# Patient Record
Sex: Female | Born: 1935 | Race: White | Hispanic: No | Marital: Married | Smoking: Never smoker
Health system: Southern US, Community
[De-identification: ages and names within clinical notes are randomized; demographics above are authoritative.]

## PROBLEM LIST (undated history)

## (undated) DIAGNOSIS — F0390 Unspecified dementia without behavioral disturbance: Secondary | ICD-10-CM

## (undated) DIAGNOSIS — N189 Chronic kidney disease, unspecified: Secondary | ICD-10-CM

## (undated) DIAGNOSIS — R64 Cachexia: Secondary | ICD-10-CM

## (undated) DIAGNOSIS — M069 Rheumatoid arthritis, unspecified: Secondary | ICD-10-CM

## (undated) HISTORY — PX: ABDOMINAL HYSTERECTOMY: SHX81

---

## 2014-11-26 ENCOUNTER — Emergency Department (HOSPITAL_BASED_OUTPATIENT_CLINIC_OR_DEPARTMENT_OTHER): Payer: Medicare Other

## 2014-11-26 ENCOUNTER — Encounter (HOSPITAL_BASED_OUTPATIENT_CLINIC_OR_DEPARTMENT_OTHER): Payer: Self-pay

## 2014-11-26 ENCOUNTER — Emergency Department (HOSPITAL_BASED_OUTPATIENT_CLINIC_OR_DEPARTMENT_OTHER)
Admission: EM | Admit: 2014-11-26 | Discharge: 2014-11-26 | Disposition: A | Discharge status: Home / Self Care | Payer: Medicare Other | Attending: Emergency Medicine | Admitting: Emergency Medicine

## 2014-11-26 DIAGNOSIS — R112 Nausea with vomiting, unspecified: Secondary | ICD-10-CM | POA: Diagnosis not present

## 2014-11-26 DIAGNOSIS — Z8739 Personal history of other diseases of the musculoskeletal system and connective tissue: Secondary | ICD-10-CM | POA: Diagnosis not present

## 2014-11-26 DIAGNOSIS — Z79899 Other long term (current) drug therapy: Secondary | ICD-10-CM | POA: Insufficient documentation

## 2014-11-26 DIAGNOSIS — R1013 Epigastric pain: Secondary | ICD-10-CM | POA: Diagnosis not present

## 2014-11-26 DIAGNOSIS — R079 Chest pain, unspecified: Secondary | ICD-10-CM | POA: Diagnosis present

## 2014-11-26 DIAGNOSIS — F0390 Unspecified dementia without behavioral disturbance: Secondary | ICD-10-CM | POA: Insufficient documentation

## 2014-11-26 HISTORY — DX: Unspecified dementia, unspecified severity, without behavioral disturbance, psychotic disturbance, mood disturbance, and anxiety: F03.90

## 2014-11-26 HISTORY — DX: Rheumatoid arthritis, unspecified: M06.9

## 2014-11-26 LAB — COMPREHENSIVE METABOLIC PANEL
ALT: 14 U/L (ref 0–35)
AST: 22 U/L (ref 0–37)
Albumin: 4.2 g/dL (ref 3.5–5.2)
Alkaline Phosphatase: 65 U/L (ref 39–117)
Anion gap: 8 (ref 5–15)
BUN: 16 mg/dL (ref 6–23)
CALCIUM: 8.9 mg/dL (ref 8.4–10.5)
CO2: 26 mmol/L (ref 19–32)
Chloride: 103 mmol/L (ref 96–112)
Creatinine, Ser: 0.83 mg/dL (ref 0.50–1.10)
GFR calc Af Amer: 74 mL/min — ABNORMAL LOW (ref >90)
GFR calc non Af Amer: 64 mL/min — ABNORMAL LOW (ref >90)
Glucose, Bld: 133 mg/dL — ABNORMAL HIGH (ref 70–99)
POTASSIUM: 3.5 mmol/L (ref 3.5–5.1)
Sodium: 137 mmol/L (ref 135–145)
TOTAL PROTEIN: 7 g/dL (ref 6.0–8.3)
Total Bilirubin: 0.5 mg/dL (ref 0.3–1.2)

## 2014-11-26 LAB — CBC WITH DIFFERENTIAL/PLATELET
BASOS PCT: 1 % (ref 0–1)
Basophils Absolute: 0.1 10*3/uL (ref 0.0–0.1)
Eosinophils Absolute: 0 10*3/uL (ref 0.0–0.7)
Eosinophils Relative: 0 % (ref 0–5)
HEMATOCRIT: 37.8 % (ref 36.0–46.0)
HEMOGLOBIN: 12.4 g/dL (ref 12.0–15.0)
Lymphocytes Relative: 8 % — ABNORMAL LOW (ref 12–46)
Lymphs Abs: 0.8 10*3/uL (ref 0.7–4.0)
MCH: 28.8 pg (ref 26.0–34.0)
MCHC: 32.8 g/dL (ref 30.0–36.0)
MCV: 87.9 fL (ref 78.0–100.0)
Monocytes Absolute: 0.6 10*3/uL (ref 0.1–1.0)
Monocytes Relative: 6 % (ref 3–12)
NEUTROS ABS: 8.3 10*3/uL — AB (ref 1.7–7.7)
Neutrophils Relative %: 85 % — ABNORMAL HIGH (ref 43–77)
PLATELETS: 192 10*3/uL (ref 150–400)
RBC: 4.3 MIL/uL (ref 3.87–5.11)
RDW: 13.5 % (ref 11.5–15.5)
WBC: 9.7 10*3/uL (ref 4.0–10.5)

## 2014-11-26 LAB — TROPONIN I: Troponin I: <0.03 ng/mL (ref <0.031)

## 2014-11-26 LAB — LIPASE, BLOOD: Lipase: 30 U/L (ref 11–59)

## 2014-11-26 MED ORDER — ESOMEPRAZOLE MAGNESIUM 40 MG PO CPDR: 40.0000 mg | DELAYED_RELEASE_CAPSULE | Freq: Every day | ORAL | Status: DC

## 2014-11-26 MED ORDER — ONDANSETRON HCL 4 MG/2ML IJ SOLN
4.0000 mg | Freq: Once | INTRAMUSCULAR | Status: AC
  Administered 2014-11-26: 4 mg via INTRAVENOUS
  Filled 2014-11-26: qty 2

## 2014-11-26 MED ORDER — FAMOTIDINE IN NACL 20-0.9 MG/50ML-% IV SOLN
20.0000 mg | Freq: Once | INTRAVENOUS | Status: AC
  Administered 2014-11-26: 20 mg via INTRAVENOUS
  Filled 2014-11-26: qty 50

## 2014-11-26 MED ORDER — ASPIRIN 81 MG PO CHEW
324.0000 mg | CHEWABLE_TABLET | Freq: Once | ORAL | Status: AC
  Administered 2014-11-26: 324 mg via ORAL
  Filled 2014-11-26: qty 4

## 2014-11-26 NOTE — ED Notes (Signed)
Husband states pt with CP and vomited x 3 approx 3pm today-pt with dementia-states she is having CP but is unable to answer all ?s

## 2014-11-26 NOTE — ED Notes (Signed)
Patient transported to X-ray 

## 2014-11-26 NOTE — ED Notes (Signed)
MD at bedside. 

## 2014-11-26 NOTE — ED Notes (Signed)
BSC set up in room. Husband is assisting pt

## 2014-11-26 NOTE — ED Provider Notes (Signed)
CSN: [id number]     Arrival date & time 11/26/14  1742 History  This chart was scribed for Connie Bucco, MD by Annye Asa, ED Scribe. This patient was seen in room MH12/MH12 and the patient's care was started at 6:30 PM.    Chief Complaint  Patient presents with  . Chest Pain   Patient is a 81 y.o. female presenting with chest pain. The history is provided by the patient and the spouse. No language interpreter was used.  Chest Pain Associated symptoms: nausea and vomiting   Associated symptoms: no cough, no diaphoresis, no fever and no shortness of breath     HPI Comments: Connie Holder is a 81 y.o. female with past medical history of rheumatoid arthritis, dementia who presents to the Emergency Department complaining of chest pain and vomiting (3x) beginning this afternoon around 15:00. Patient's husband explains that she began vomiting before complaining of chest pain. She feels nauseous at present but denies any pain. She denies diaphoresis, SOB, diarrhea, dysuria, difficulty urinating, cough or cold symptoms, fevers. Husband denies prior experience with similar symptoms.  Patient took her Donepezil HCl as prescribed this morning.    Past Medical History  Diagnosis Date  . RA (rheumatoid arthritis)   . Dementia    Past Surgical History  Procedure Laterality Date  . Abdominal hysterectomy     No family history on file. History  Substance Use Topics  . Smoking status: Never Smoker   . Smokeless tobacco: Not on file  . Alcohol Use: No   OB History    No data available     Review of Systems  Constitutional: Negative for fever and diaphoresis.  Respiratory: Negative for cough and shortness of breath.   Cardiovascular: Positive for chest pain.  Gastrointestinal: Positive for nausea and vomiting. Negative for diarrhea.  Genitourinary: Negative for dysuria and difficulty urinating.  All other systems reviewed and are negative.   Allergies  Review of patient's allergies  indicates no known allergies.  Home Medications   Prior to Admission medications   Medication Sig Start Date End Date Taking? Authorizing Provider  DONEPEZIL HCL PO Take by mouth.   Yes Historical Provider, MD  Levothyroxine Sodium (SYNTHROID PO) Take by mouth.   Yes Historical Provider, MD  Memantine HCl (NAMENDA PO) Take by mouth.   Yes Historical Provider, MD  esomeprazole (NEXIUM) 40 MG capsule Take 1 capsule (40 mg total) by mouth daily. 11/26/14   Connie Bucco, MD   BP 150/78 mmHg  Pulse 77  Temp(Src) 97.8 F (36.6 C) (Oral)  Resp 35  Wt 115 lb (52.164 kg)  SpO2 97% Physical Exam  Constitutional: She is oriented to person, place, and time. She appears well-developed and well-nourished.  HENT:  Head: Normocephalic and atraumatic.  Eyes: Pupils are equal, round, and reactive to light.  Neck: Normal range of motion. Neck supple.  Cardiovascular: Normal rate, regular rhythm and normal heart sounds.   Pulmonary/Chest: Effort normal and breath sounds normal. No respiratory distress. She has no wheezes. She has no rales. She exhibits no tenderness.  Abdominal: Soft. Bowel sounds are normal. There is no tenderness. There is no rebound and no guarding.  Musculoskeletal: Normal range of motion. She exhibits no edema.  Lymphadenopathy:    She has no cervical adenopathy.  Neurological: She is alert and oriented to person, place, and time.  Skin: Skin is warm and dry. No rash noted.  Psychiatric: She has a normal mood and affect.    ED Course  Procedures   DIAGNOSTIC STUDIES: Oxygen Saturation is 100% on RA, normal by my interpretation.    COORDINATION OF CARE: 6:37 PM Discussed treatment plan with pt at bedside and pt agreed to plan.  Results for orders placed or performed during the hospital encounter of 11/26/14  Comprehensive metabolic panel  Result Value Ref Range   Sodium 137 135 - 145 mmol/L   Potassium 3.5 3.5 - 5.1 mmol/L   Chloride 103 96 - 112 mmol/L   CO2 26 19 -  32 mmol/L   Glucose, Bld 133 (H) 70 - 99 mg/dL   BUN 16 6 - 23 mg/dL   Creatinine, Ser 6.21 0.50 - 1.10 mg/dL   Calcium 8.9 8.4 - 30.8 mg/dL   Total Protein 7.0 6.0 - 8.3 g/dL   Albumin 4.2 3.5 - 5.2 g/dL   AST 22 0 - 37 U/L   ALT 14 0 - 35 U/L   Alkaline Phosphatase 65 39 - 117 U/L   Total Bilirubin 0.5 0.3 - 1.2 mg/dL   GFR calc non Af Amer 64 (L) >90 mL/min   GFR calc Af Amer 74 (L) >90 mL/min   Anion gap 8 5 - 15  Lipase, blood  Result Value Ref Range   Lipase 30 11 - 59 U/L  CBC with Differential  Result Value Ref Range   WBC 9.7 4.0 - 10.5 K/uL   RBC 4.30 3.87 - 5.11 MIL/uL   Hemoglobin 12.4 12.0 - 15.0 g/dL   HCT 65.7 84.6 - 96.2 %   MCV 87.9 78.0 - 100.0 fL   MCH 28.8 26.0 - 34.0 pg   MCHC 32.8 30.0 - 36.0 g/dL   RDW 95.2 84.1 - 32.4 %   Platelets 192 150 - 400 K/uL   Neutrophils Relative % 85 (H) 43 - 77 %   Neutro Abs 8.3 (H) 1.7 - 7.7 K/uL   Lymphocytes Relative 8 (L) 12 - 46 %   Lymphs Abs 0.8 0.7 - 4.0 K/uL   Monocytes Relative 6 3 - 12 %   Monocytes Absolute 0.6 0.1 - 1.0 K/uL   Eosinophils Relative 0 0 - 5 %   Eosinophils Absolute 0.0 0.0 - 0.7 K/uL   Basophils Relative 1 0 - 1 %   Basophils Absolute 0.1 0.0 - 0.1 K/uL  Troponin I  Result Value Ref Range   Troponin I <0.03 <0.031 ng/mL  Troponin I  Result Value Ref Range   Troponin I <0.03 <0.031 ng/mL   Dg Abd Acute W/chest  11/26/2014   CLINICAL DATA:  In with nausea and vomiting.  EXAM: ACUTE ABDOMEN SERIES (ABDOMEN 2 VIEW & CHEST 1 VIEW)  COMPARISON:  None.  FINDINGS: Normal cardiac contours. Tortuosity of the thoracic aorta. No consolidative pulmonary opacities. No pleural effusion or pneumothorax. Stool is demonstrated throughout the colon. No gaseous distended loops of small bowel are demonstrated. No evidence for free intraperitoneal air. Lower lumbar spine degenerative changes.  IMPRESSION: Stool throughout the colon as can be seen with constipation.  No acute cardiopulmonary process.    Electronically Signed   By: Annia Belt M.D.   On: 11/26/2014 20:10    Labs Review Labs Reviewed  COMPREHENSIVE METABOLIC PANEL - Abnormal; Notable for the following:    Glucose, Bld 133 (*)    GFR calc non Af Amer 64 (*)    GFR calc Af Amer 74 (*)    All other components within normal limits  CBC WITH DIFFERENTIAL/PLATELET - Abnormal; Notable for the  following:    Neutrophils Relative % 85 (*)    Neutro Abs 8.3 (*)    Lymphocytes Relative 8 (*)    All other components within normal limits  LIPASE, BLOOD  TROPONIN I  TROPONIN I   Imaging Review Dg Abd Acute W/chest  11/26/2014   CLINICAL DATA:  In with nausea and vomiting.  EXAM: ACUTE ABDOMEN SERIES (ABDOMEN 2 VIEW & CHEST 1 VIEW)  COMPARISON:  None.  FINDINGS: Normal cardiac contours. Tortuosity of the thoracic aorta. No consolidative pulmonary opacities. No pleural effusion or pneumothorax. Stool is demonstrated throughout the colon. No gaseous distended loops of small bowel are demonstrated. No evidence for free intraperitoneal air. Lower lumbar spine degenerative changes.  IMPRESSION: Stool throughout the colon as can be seen with constipation.  No acute cardiopulmonary process.   Electronically Signed   By: Annia Belt M.D.   On: 11/26/2014 20:10     EKG Interpretation   Date/Time:  Monday November 26 2014 17:50:24 EDT Ventricular Rate:  69 PR Interval:  156 QRS Duration: 84 QT Interval:  420 QTC Calculation: 450 R Axis:   65 Text Interpretation:  Sinus rhythm with occasional Premature ventricular  complexes Possible Left atrial enlargement Borderline ECG No old tracing  to compare Confirmed by Aminta Sakurai  MD, Ladarius Seubert ([id number]) on 11/27/2014 12:30:05 AM      MDM   Final diagnoses:  Epigastric pain   Patient presents with vague complaints of chest pain that started after vomiting. She had no other associated symptoms. Her EKG does not show any ischemia. She's had delta troponin that is negative. Her chest x-ray does not show any  evidence of acute disease or obstruction on her abdominal films. She was given dose of Zofran and Pepcid in the ED. She's feeling much better. I did discuss options with the patient and her husband. Options of being possibly admitting the patient for further cardiac evaluation versus close follow-up with her primary care physician. The patient and her husband are not wanting to be admitted at this point. I feel that her symptoms are more consistent with likely being GI in nature. Seems that the vomiting started rather suddenly and then her chest started hurting. She was not diaphoretic. Her symptoms were nonexertional. At this point I feel that she can be discharged home with close follow-up with her primary care physician. She was advised to return if her symptoms were to worsen.  I personally performed the services described in this documentation, which was scribed in my presence.  The recorded information has been reviewed and considered.      Connie Bucco, MD 11/27/14 [phone]

## 2015-08-09 ENCOUNTER — Emergency Department (HOSPITAL_COMMUNITY): Payer: Medicare Other

## 2015-08-09 ENCOUNTER — Inpatient Hospital Stay (HOSPITAL_COMMUNITY)
Admission: EM | Admit: 2015-08-09 | Discharge: 2015-08-13 | DRG: 470 | Disposition: A | Discharge status: Skilled Nursing Facility | Payer: Medicare Other | Attending: Internal Medicine | Admitting: Internal Medicine

## 2015-08-09 ENCOUNTER — Encounter (HOSPITAL_COMMUNITY): Payer: Self-pay | Admitting: Emergency Medicine

## 2015-08-09 DIAGNOSIS — F0391 Unspecified dementia with behavioral disturbance: Secondary | ICD-10-CM | POA: Diagnosis present

## 2015-08-09 DIAGNOSIS — Z419 Encounter for procedure for purposes other than remedying health state, unspecified: Secondary | ICD-10-CM

## 2015-08-09 DIAGNOSIS — Z91018 Allergy to other foods: Secondary | ICD-10-CM

## 2015-08-09 DIAGNOSIS — W010XXA Fall on same level from slipping, tripping and stumbling without subsequent striking against object, initial encounter: Secondary | ICD-10-CM | POA: Diagnosis present

## 2015-08-09 DIAGNOSIS — R509 Fever, unspecified: Secondary | ICD-10-CM | POA: Diagnosis not present

## 2015-08-09 DIAGNOSIS — Z91010 Allergy to peanuts: Secondary | ICD-10-CM

## 2015-08-09 DIAGNOSIS — S72011A Unspecified intracapsular fracture of right femur, initial encounter for closed fracture: Principal | ICD-10-CM | POA: Diagnosis present

## 2015-08-09 DIAGNOSIS — W19XXXA Unspecified fall, initial encounter: Secondary | ICD-10-CM | POA: Diagnosis not present

## 2015-08-09 DIAGNOSIS — D72829 Elevated white blood cell count, unspecified: Secondary | ICD-10-CM | POA: Diagnosis present

## 2015-08-09 DIAGNOSIS — D62 Acute posthemorrhagic anemia: Secondary | ICD-10-CM | POA: Diagnosis not present

## 2015-08-09 DIAGNOSIS — D649 Anemia, unspecified: Secondary | ICD-10-CM | POA: Diagnosis not present

## 2015-08-09 DIAGNOSIS — Y92099 Unspecified place in other non-institutional residence as the place of occurrence of the external cause: Secondary | ICD-10-CM

## 2015-08-09 DIAGNOSIS — Z79899 Other long term (current) drug therapy: Secondary | ICD-10-CM

## 2015-08-09 DIAGNOSIS — S72001A Fracture of unspecified part of neck of right femur, initial encounter for closed fracture: Secondary | ICD-10-CM

## 2015-08-09 DIAGNOSIS — Z886 Allergy status to analgesic agent status: Secondary | ICD-10-CM | POA: Diagnosis not present

## 2015-08-09 DIAGNOSIS — R739 Hyperglycemia, unspecified: Secondary | ICD-10-CM | POA: Diagnosis present

## 2015-08-09 DIAGNOSIS — Z96649 Presence of unspecified artificial hip joint: Secondary | ICD-10-CM

## 2015-08-09 DIAGNOSIS — M069 Rheumatoid arthritis, unspecified: Secondary | ICD-10-CM | POA: Diagnosis present

## 2015-08-09 DIAGNOSIS — Y92009 Unspecified place in unspecified non-institutional (private) residence as the place of occurrence of the external cause: Secondary | ICD-10-CM

## 2015-08-09 DIAGNOSIS — F0390 Unspecified dementia without behavioral disturbance: Secondary | ICD-10-CM | POA: Diagnosis not present

## 2015-08-09 LAB — CBC WITH DIFFERENTIAL/PLATELET
BASOS PCT: 0 %
Basophils Absolute: 0 10*3/uL (ref 0.0–0.1)
EOS PCT: 0 %
Eosinophils Absolute: 0 10*3/uL (ref 0.0–0.7)
HCT: 38.5 % (ref 36.0–46.0)
Hemoglobin: 12.4 g/dL (ref 12.0–15.0)
Lymphocytes Relative: 5 %
Lymphs Abs: 0.8 10*3/uL (ref 0.7–4.0)
MCH: 29.1 pg (ref 26.0–34.0)
MCHC: 32.2 g/dL (ref 30.0–36.0)
MCV: 90.4 fL (ref 78.0–100.0)
MONO ABS: 1.6 10*3/uL — AB (ref 0.1–1.0)
MONOS PCT: 10 %
NEUTROS ABS: 12.8 10*3/uL — AB (ref 1.7–7.7)
Neutrophils Relative %: 85 %
Platelets: 192 10*3/uL (ref 150–400)
RBC: 4.26 MIL/uL (ref 3.87–5.11)
RDW: 14.5 % (ref 11.5–15.5)
WBC: 15.3 10*3/uL — ABNORMAL HIGH (ref 4.0–10.5)

## 2015-08-09 LAB — PROTIME-INR
INR: 1.13 (ref 0.00–1.49)
Prothrombin Time: 14.7 seconds (ref 11.6–15.2)

## 2015-08-09 LAB — BASIC METABOLIC PANEL
ANION GAP: 9 (ref 5–15)
BUN: 14 mg/dL (ref 6–20)
CALCIUM: 8.8 mg/dL — AB (ref 8.9–10.3)
CO2: 24 mmol/L (ref 22–32)
Chloride: 106 mmol/L (ref 101–111)
Creatinine, Ser: 1.04 mg/dL — ABNORMAL HIGH (ref 0.44–1.00)
GFR calc Af Amer: 56 mL/min — ABNORMAL LOW (ref >60)
GFR calc non Af Amer: 48 mL/min — ABNORMAL LOW (ref >60)
GLUCOSE: 149 mg/dL — AB (ref 65–99)
Potassium: 3.8 mmol/L (ref 3.5–5.1)
Sodium: 139 mmol/L (ref 135–145)

## 2015-08-09 LAB — TYPE AND SCREEN
ABO/RH(D): O NEG
Antibody Screen: NEGATIVE

## 2015-08-09 MED ORDER — MORPHINE SULFATE (PF) 2 MG/ML IV SOLN
2.0000 mg | INTRAVENOUS | Status: DC | PRN
  Administered 2015-08-09: 2 mg via INTRAVENOUS
  Filled 2015-08-09: qty 1

## 2015-08-09 MED ORDER — SODIUM CHLORIDE 0.9 % IV BOLUS (SEPSIS)
500.0000 mL | Freq: Once | INTRAVENOUS | Status: AC
  Administered 2015-08-09: 500 mL via INTRAVENOUS

## 2015-08-09 MED ORDER — ONDANSETRON HCL 4 MG/2ML IJ SOLN: 4.0000 mg | Freq: Three times a day (TID) | INTRAMUSCULAR | Status: AC | PRN

## 2015-08-09 MED ORDER — HYDROCODONE-ACETAMINOPHEN 5-325 MG PO TABS
1.0000 | ORAL_TABLET | ORAL | Status: AC | PRN
  Filled 2015-08-09: qty 2

## 2015-08-09 MED ORDER — ONDANSETRON HCL 4 MG/2ML IJ SOLN
4.0000 mg | Freq: Once | INTRAMUSCULAR | Status: AC
  Administered 2015-08-09: 4 mg via INTRAVENOUS
  Filled 2015-08-09: qty 2

## 2015-08-09 NOTE — ED Notes (Signed)
Per EMS, patient had a fall outside [employer] around 1830. Missed step off, mechanical fall. She went inside the restaurant, and was able to get back in car ok. Once home, patient started complaining of right hip pain that was unbearable. Patient very anxious on arrival, and reports pain in right hip.

## 2015-08-09 NOTE — ED Provider Notes (Signed)
CSN: [id number]     Arrival date & time 08/09/15  2120 History   First MD Initiated Contact with Patient 08/09/15 2130     Chief Complaint  Patient presents with  . Fall     (Consider location/radiation/quality/duration/timing/severity/associated sxs/prior Treatment) Patient is a 81 y.o. female presenting with fall. The history is provided by the patient.  Fall This is a new problem. The current episode started today. The problem occurs intermittently. The problem has been gradually worsening. Associated symptoms include arthralgias and myalgias. Pertinent negatives include no abdominal pain, anorexia, change in bowel habit, chest pain, chills, congestion, coughing, diaphoresis, fatigue, fever, headaches, nausea, neck pain, numbness, rash, urinary symptoms, visual change or vomiting. The symptoms are aggravated by walking. She has tried rest, walking and position changes for the symptoms. The treatment provided no relief.    Past Medical History  Diagnosis Date  . RA (rheumatoid arthritis) (HCC)   . Dementia    Past Surgical History  Procedure Laterality Date  . Abdominal hysterectomy     History reviewed. No pertinent family history. Social History  Substance Use Topics  . Smoking status: Never Smoker   . Smokeless tobacco: None  . Alcohol Use: No   OB History    No data available     Review of Systems  Constitutional: Negative for fever, chills, diaphoresis and fatigue.  HENT: Negative for congestion and trouble swallowing.   Eyes: Negative for pain and visual disturbance.  Respiratory: Negative for cough.   Cardiovascular: Negative for chest pain.  Gastrointestinal: Negative for nausea, vomiting, abdominal pain, anorexia and change in bowel habit.  Genitourinary: Negative for dysuria, flank pain, vaginal bleeding and vaginal discharge.  Musculoskeletal: Positive for myalgias, arthralgias and gait problem. Negative for back pain and neck pain.  Skin: Negative for rash.   Neurological: Negative for numbness and headaches.  Psychiatric/Behavioral: Positive for confusion.      Allergies  Review of patient's allergies indicates no known allergies.  Home Medications   Prior to Admission medications   Medication Sig Start Date End Date Taking? Authorizing Provider  DONEPEZIL HCL PO Take by mouth.    Historical Provider, MD  esomeprazole (NEXIUM) 40 MG capsule Take 1 capsule (40 mg total) by mouth daily. 11/26/14   Rolan Bucco, MD  Levothyroxine Sodium (SYNTHROID PO) Take by mouth.    Historical Provider, MD  Memantine HCl (NAMENDA PO) Take by mouth.    Historical Provider, MD   There were no vitals taken for this visit. Physical Exam  Constitutional: She appears well-developed and well-nourished. She appears distressed.  HENT:  Head: Normocephalic and atraumatic.  Mouth/Throat: No oropharyngeal exudate.  Eyes: Conjunctivae and EOM are normal. Pupils are equal, round, and reactive to light.  Neck: Normal range of motion. Neck supple.  Cardiovascular: Normal rate, regular rhythm and normal heart sounds.  Exam reveals no gallop and no friction rub.   No murmur heard. Pulmonary/Chest: Effort normal and breath sounds normal. No respiratory distress. She has no wheezes. She has no rales. She exhibits no tenderness.  Abdominal: Soft. Bowel sounds are normal. She exhibits no distension. There is no tenderness. There is no rebound and no guarding.  Musculoskeletal:       Right hip: She exhibits decreased range of motion, decreased strength, tenderness, bony tenderness and deformity.       Right knee: She exhibits normal range of motion, no swelling, no effusion, no ecchymosis and no deformity. No tenderness found.  Right ankle: She exhibits normal range of motion, no swelling, no ecchymosis, no deformity and no laceration. No tenderness.       Cervical back: She exhibits normal range of motion, no tenderness, no bony tenderness, no edema, no deformity and  no pain.       Right upper leg: She exhibits no tenderness, no bony tenderness, no swelling, no edema and no deformity.       Right lower leg: She exhibits no tenderness, no bony tenderness, no swelling, no edema and no deformity.  Neurological: She is alert. No cranial nerve deficit.  Skin: Skin is warm and dry.  Psychiatric: She has a normal mood and affect. Her speech is normal. Cognition and memory are impaired.    ED Course  Procedures (including critical care time) Labs Review Labs Reviewed  CBC WITH DIFFERENTIAL/PLATELET - Abnormal; Notable for the following:    WBC 15.3 (*)    Neutro Abs 12.8 (*)    Monocytes Absolute 1.6 (*)    All other components within normal limits  PROTIME-INR  BASIC METABOLIC PANEL  TYPE AND SCREEN    Imaging Review Dg Hip Unilat  With Pelvis 2-3 Views Right  08/09/2015  CLINICAL DATA:  Right hip pain. EXAM: DG HIP (WITH OR WITHOUT PELVIS) 2-3V RIGHT COMPARISON:  None. FINDINGS: There is an acute subcapital femoral neck fracture involving the right hip. There is proximal displacement of the distal fracture fragments. No dislocation. IMPRESSION: 1. Acute subcapital fracture involves the right hip. Electronically Signed   By: Signa Kell M.D.   On: 08/09/2015 21:57   I have personally reviewed and evaluated these images and lab results as part of my medical decision-making.   EKG Interpretation None      MDM   Final diagnoses:  None    81 year old Caucasian female presents in setting of fall. Per husband as patient has dementia, they were walking to restaurant tonight when patient tripped on a curb falling onto her right hip. Patient was able to get up after event and walked in to restaurant. Patient had dinner but when trying to leave the restaurant she began to complain of right hip pain. Pain continued to worsen and she was brought to the emergency department for further evaluation.  On arrival patient was hemodynamically stable with  deformity to her right hip with significant pain. X-ray revealed hip fracture. I personally reviewed imaging. Considering this finding orthopedics was consulted. I discussed case with Dr. Roda Shutters please patient is appropriate for medicine admission. Patient will have surgical intervention tomorrow.  IV pain medications given  Medicine was consult and for admission and patient is not currently on any medications. Patient will be admitted to medicine this time for further management of hip fracture. Patient stable at time of admission.  Pain improved on reassessment.  Attending has seen and evaluated patient and Dr. Jeraldine Loots is in agreement with plan.    Stacy Gardner, MD 08/10/15 Moses Manners  Gerhard Munch, MD 08/10/15 [phone]

## 2015-08-09 NOTE — H&P (Signed)
[facility] Admission History and Physical       Diala Waxman BWL:[id number] DOB: 09-10-1929 DOA: 08/09/2015  Referring physician: EDP PCP: Dr. Derrell Lolling Specialists:   Chief Complaint: Right Hip Pain after Falling  HPI: Connie Holder is a 81 y.o. female with a history of Rheumatoid Arthritis, and Dementia who was brought to the ED after she got out of the car and stepped on the curb and stumbled and fell backward onto her right side.  She had increased pain walking into the restaurant, and the pain kept increasing so she was brought to the ED.  An X-ray revealed a subcapital fracture of the right hip.  DR. Xu of Orthopedics was consulted and patient was referred for admission   Review of Systems: Unable to Obtain from the Patient  Past Medical History  Diagnosis Date  . RA (rheumatoid arthritis) (HCC)   . Dementia      Past Surgical History  Procedure Laterality Date  . Abdominal hysterectomy        Prior to Admission medications   Medication Sig Start Date End Date Taking? Authorizing Provider  esomeprazole (NEXIUM) 40 MG capsule Take 1 capsule (40 mg total) by mouth daily. Patient not taking: Reported on 08/09/2015 11/26/14   Rolan Bucco, MD     Allergies  Allergen Reactions  . Aspirin Other (See Comments)  . Other Swelling    All nut products Mouth sores  . Peanut Oil Other (See Comments)    Social History:  reports that she has never smoked. She does not have any smokeless tobacco history on file. She reports that she does not drink alcohol. Her drug history is not on file.     History reviewed. No pertinent family history.     Physical Exam:  GEN:  Pleasant Thin Elderly 81 y.o. Caucasian female examined and in no acute distress; cooperative with exam Filed Vitals:   08/09/15 2215 08/09/15 2230 08/09/15 2245 08/09/15 2300  BP:  138/65 137/67 140/65  Pulse:  65 68 69  Temp: 98.1 F (36.7 C)     TempSrc: Oral     Resp:      SpO2:  97% 98% 96%    Blood pressure 140/65, pulse 69, temperature 98.1 F (36.7 C), temperature source Oral, resp. rate 16, SpO2 96 %. PSYCH: She is alert and oriented x4; does not appear anxious does not appear depressed; affect is normal HEENT: Normocephalic and Atraumatic, Mucous membranes pink; PERRLA; EOM intact; Fundi:  Benign;  No scleral icterus, Nares: Patent, Oropharynx: Clear, Fair Dentition,    Neck:  FROM, No Cervical Lymphadenopathy nor Thyromegaly or Carotid Bruit; No JVD; Breasts:: Not examined CHEST WALL: No tenderness CHEST: Normal respiration, clear to auscultation bilaterally HEART: Regular rate and rhythm; no murmurs rubs or gallops BACK: No kyphosis or scoliosis; No CVA tenderness ABDOMEN: Positive Bowel Sounds, Scaphoid, Soft Non-Tender, No Rebound or Guarding; No Masses, No Organomegaly. Rectal Exam: Not done EXTREMITIES: No Cyanosis, Clubbing, or Edema; No Ulcerations. Genitalia: not examined PULSES: 2+ and symmetric SKIN: Normal hydration no rash or ulceration CNS:  Alert and Oriented x1, No Focal Deficits Vascular: pulses palpable throughout    Labs on Admission:  Basic Metabolic Panel: No results for input(s): NA, K, CL, CO2, GLUCOSE, BUN, CREATININE, CALCIUM, MG, PHOS in the last 168 hours. Liver Function Tests: No results for input(s): AST, ALT, ALKPHOS, BILITOT, PROT, ALBUMIN in the last 168 hours. No results for input(s): LIPASE, AMYLASE in the last 168 hours. No results for  input(s): AMMONIA in the last 168 hours. CBC:  Recent Labs Lab 08/09/15 2237  WBC 15.3*  NEUTROABS 12.8*  HGB 12.4  HCT 38.5  MCV 90.4  PLT 192   Cardiac Enzymes: No results for input(s): CKTOTAL, CKMB, CKMBINDEX, TROPONINI in the last 168 hours.  BNP (last 3 results) No results for input(s): BNP in the last 8760 hours.  ProBNP (last 3 results) No results for input(s): PROBNP in the last 8760 hours.  CBG: No results for input(s): GLUCAP in the last 168 hours.  Radiological Exams  on Admission: Dg Hip Unilat  With Pelvis 2-3 Views Right  08/09/2015  CLINICAL DATA:  Right hip pain. EXAM: DG HIP (WITH OR WITHOUT PELVIS) 2-3V RIGHT COMPARISON:  None. FINDINGS: There is an acute subcapital femoral neck fracture involving the right hip. There is proximal displacement of the distal fracture fragments. No dislocation. IMPRESSION: 1. Acute subcapital fracture involves the right hip. Electronically Signed   By: Signa Kell M.D.   On: 08/09/2015 21:57      Assessment/Plan:     81 y.o.  female with  Active Problems:   1.     Closed right hip fracture [facility])   Orthopedics Consulted Dr Roda Shutters to see in AM   NPO after Midnight   Pain Control with IV Dilaudid        2.     Fall at home- Mechanical in [place] Precautions     3.     RA (rheumatoid arthritis) (HCC)   Not On Rx     4.     Dementia   Not on Rx     5.     DVT prophylaxis   SCDs    Code Status:     FULL CODE     Family Communication:   Husband and Family at Bedside   Disposition Plan:    Inpatient Status        Time spent:  69 Minutes      Ron Parker [facility] Pager [phone]   If 7AM -7PM Please Contact the Day Rounding Team MD for [facility]  If 7PM-7AM, Please Contact Night-Floor Coverage  [email or web address] Password [facility] 08/09/2015, 11:39 PM     ADDENDUM:   Patient was seen and examined on 08/09/2015

## 2015-08-09 NOTE — ED Notes (Signed)
Patient currently in xray ?

## 2015-08-09 NOTE — ED Notes (Signed)
Called main lab to inquire about bmp results.

## 2015-08-10 ENCOUNTER — Inpatient Hospital Stay (HOSPITAL_COMMUNITY): Payer: Medicare Other

## 2015-08-10 ENCOUNTER — Inpatient Hospital Stay (HOSPITAL_COMMUNITY): Payer: Medicare Other | Admitting: Anesthesiology

## 2015-08-10 ENCOUNTER — Encounter (HOSPITAL_COMMUNITY)
Admission: EM | Disposition: A | Discharge status: Skilled Nursing Facility | Payer: Self-pay | Source: Home / Self Care | Attending: Internal Medicine

## 2015-08-10 DIAGNOSIS — D72829 Elevated white blood cell count, unspecified: Secondary | ICD-10-CM

## 2015-08-10 DIAGNOSIS — D649 Anemia, unspecified: Secondary | ICD-10-CM

## 2015-08-10 HISTORY — PX: TOTAL HIP ARTHROPLASTY: SHX124

## 2015-08-10 LAB — CBC
HCT: 35 % — ABNORMAL LOW (ref 36.0–46.0)
Hemoglobin: 11.4 g/dL — ABNORMAL LOW (ref 12.0–15.0)
MCH: 29.4 pg (ref 26.0–34.0)
MCHC: 32.6 g/dL (ref 30.0–36.0)
MCV: 90.2 fL (ref 78.0–100.0)
PLATELETS: 171 10*3/uL (ref 150–400)
RBC: 3.88 MIL/uL (ref 3.87–5.11)
RDW: 14.5 % (ref 11.5–15.5)
WBC: 13 10*3/uL — ABNORMAL HIGH (ref 4.0–10.5)

## 2015-08-10 LAB — BASIC METABOLIC PANEL
Anion gap: 8 (ref 5–15)
BUN: 11 mg/dL (ref 6–20)
CHLORIDE: 108 mmol/L (ref 101–111)
CO2: 23 mmol/L (ref 22–32)
CREATININE: 0.92 mg/dL (ref 0.44–1.00)
Calcium: 8.5 mg/dL — ABNORMAL LOW (ref 8.9–10.3)
GFR calc Af Amer: >60 mL/min (ref >60)
GFR calc non Af Amer: 56 mL/min — ABNORMAL LOW (ref >60)
GLUCOSE: 147 mg/dL — AB (ref 65–99)
Potassium: 4.2 mmol/L (ref 3.5–5.1)
Sodium: 139 mmol/L (ref 135–145)

## 2015-08-10 LAB — MRSA PCR SCREENING: MRSA by PCR: NEGATIVE

## 2015-08-10 LAB — ABO/RH: ABO/RH(D): O NEG

## 2015-08-10 SURGERY — ARTHROPLASTY, HIP, TOTAL, ANTERIOR APPROACH
Anesthesia: General | Site: Hip | Laterality: Right

## 2015-08-10 MED ORDER — ENOXAPARIN SODIUM 40 MG/0.4ML ~~LOC~~ SOLN
40.0000 mg | SUBCUTANEOUS | Status: DC
  Administered 2015-08-11 – 2015-08-13 (×3): 40 mg via SUBCUTANEOUS
  Filled 2015-08-10 (×3): qty 0.4

## 2015-08-10 MED ORDER — OXYCODONE HCL 5 MG PO TABS: 5.0000 mg | ORAL_TABLET | ORAL | Status: AC | PRN

## 2015-08-10 MED ORDER — SODIUM CHLORIDE 0.9 % IV SOLN
INTRAVENOUS | Status: DC
  Administered 2015-08-10: 01:00:00 via INTRAVENOUS

## 2015-08-10 MED ORDER — FENTANYL CITRATE (PF) 250 MCG/5ML IJ SOLN
INTRAMUSCULAR | Status: DC | PRN
  Administered 2015-08-10 (×2): 50 ug via INTRAVENOUS

## 2015-08-10 MED ORDER — ONDANSETRON HCL 4 MG/2ML IJ SOLN
INTRAMUSCULAR | Status: DC | PRN
  Administered 2015-08-10: 4 mg via INTRAVENOUS

## 2015-08-10 MED ORDER — PROPOFOL 10 MG/ML IV BOLUS
INTRAVENOUS | Status: DC | PRN
  Administered 2015-08-10: 50 mg via INTRAVENOUS

## 2015-08-10 MED ORDER — PHENYLEPHRINE HCL 10 MG/ML IJ SOLN
INTRAMUSCULAR | Status: DC | PRN
  Administered 2015-08-10 (×3): 80 ug via INTRAVENOUS

## 2015-08-10 MED ORDER — PROPOFOL 10 MG/ML IV BOLUS
INTRAVENOUS | Status: AC
  Filled 2015-08-10: qty 20

## 2015-08-10 MED ORDER — CEFAZOLIN SODIUM-DEXTROSE 2-3 GM-% IV SOLR
2.0000 g | Freq: Once | INTRAVENOUS | Status: AC
  Administered 2015-08-10: 2 g via INTRAVENOUS
  Filled 2015-08-10 (×2): qty 50

## 2015-08-10 MED ORDER — ONDANSETRON HCL 4 MG/2ML IJ SOLN
INTRAMUSCULAR | Status: AC
  Filled 2015-08-10: qty 2

## 2015-08-10 MED ORDER — OXYCODONE HCL 5 MG PO TABS
5.0000 mg | ORAL_TABLET | ORAL | Status: DC | PRN
  Administered 2015-08-10 – 2015-08-11 (×2): 5 mg via ORAL
  Administered 2015-08-12 – 2015-08-13 (×2): 10 mg via ORAL
  Filled 2015-08-10: qty 1
  Filled 2015-08-10: qty 2
  Filled 2015-08-10: qty 1
  Filled 2015-08-10: qty 2

## 2015-08-10 MED ORDER — LIDOCAINE HCL (CARDIAC) 20 MG/ML IV SOLN
INTRAVENOUS | Status: AC
  Filled 2015-08-10: qty 5

## 2015-08-10 MED ORDER — PHENYLEPHRINE HCL 10 MG/ML IJ SOLN
10.0000 mg | INTRAMUSCULAR | Status: DC | PRN
  Administered 2015-08-10: 60 ug/min via INTRAVENOUS

## 2015-08-10 MED ORDER — PHENOL 1.4 % MT LIQD: 1.0000 | OROMUCOSAL | Status: DC | PRN

## 2015-08-10 MED ORDER — GLYCOPYRROLATE 0.2 MG/ML IJ SOLN
INTRAMUSCULAR | Status: AC
  Filled 2015-08-10: qty 3

## 2015-08-10 MED ORDER — SODIUM CHLORIDE 0.9 % IV SOLN
INTRAVENOUS | Status: DC
  Administered 2015-08-10: 125 mL/h via INTRAVENOUS
  Administered 2015-08-11 (×2): via INTRAVENOUS

## 2015-08-10 MED ORDER — HYDROMORPHONE HCL 1 MG/ML IJ SOLN
0.5000 mg | INTRAMUSCULAR | Status: DC | PRN
Start: 2015-08-10 — End: 2015-08-13
  Administered 2015-08-10 (×2): 0.5 mg via INTRAVENOUS
  Administered 2015-08-10 – 2015-08-12 (×3): 1 mg via INTRAVENOUS
  Administered 2015-08-12: 0.5 mg via INTRAVENOUS
  Filled 2015-08-10 (×5): qty 1

## 2015-08-10 MED ORDER — ACETAMINOPHEN 650 MG RE SUPP: 650.0000 mg | Freq: Four times a day (QID) | RECTAL | Status: DC | PRN

## 2015-08-10 MED ORDER — ALUM & MAG HYDROXIDE-SIMETH 200-200-20 MG/5ML PO SUSP: 30.0000 mL | Freq: Four times a day (QID) | ORAL | Status: DC | PRN

## 2015-08-10 MED ORDER — LACTATED RINGERS IV SOLN
INTRAVENOUS | Status: DC
  Administered 2015-08-10: 11:00:00 via INTRAVENOUS

## 2015-08-10 MED ORDER — METHOCARBAMOL 500 MG PO TABS: 500.0000 mg | ORAL_TABLET | Freq: Four times a day (QID) | ORAL | Status: DC | PRN

## 2015-08-10 MED ORDER — METOCLOPRAMIDE HCL 5 MG PO TABS: 5.0000 mg | ORAL_TABLET | Freq: Three times a day (TID) | ORAL | Status: DC | PRN

## 2015-08-10 MED ORDER — ONDANSETRON HCL 4 MG PO TABS: 4.0000 mg | ORAL_TABLET | Freq: Four times a day (QID) | ORAL | Status: DC | PRN

## 2015-08-10 MED ORDER — ONDANSETRON HCL 4 MG/2ML IJ SOLN: 4.0000 mg | Freq: Four times a day (QID) | INTRAMUSCULAR | Status: DC | PRN

## 2015-08-10 MED ORDER — ROCURONIUM BROMIDE 50 MG/5ML IV SOLN
INTRAVENOUS | Status: AC
  Filled 2015-08-10: qty 1

## 2015-08-10 MED ORDER — ACETAMINOPHEN 325 MG PO TABS
650.0000 mg | ORAL_TABLET | Freq: Four times a day (QID) | ORAL | Status: DC | PRN
  Administered 2015-08-11: 650 mg via ORAL
  Filled 2015-08-10 (×2): qty 2

## 2015-08-10 MED ORDER — MORPHINE SULFATE (PF) 2 MG/ML IV SOLN: 0.5000 mg | INTRAVENOUS | Status: DC | PRN

## 2015-08-10 MED ORDER — EPHEDRINE SULFATE 50 MG/ML IJ SOLN
INTRAMUSCULAR | Status: DC | PRN
  Administered 2015-08-10: 5 mg via INTRAVENOUS

## 2015-08-10 MED ORDER — ALUM & MAG HYDROXIDE-SIMETH 200-200-20 MG/5ML PO SUSP: 30.0000 mL | ORAL | Status: DC | PRN

## 2015-08-10 MED ORDER — NEOSTIGMINE METHYLSULFATE 10 MG/10ML IV SOLN
INTRAVENOUS | Status: AC
  Filled 2015-08-10: qty 1

## 2015-08-10 MED ORDER — SODIUM CHLORIDE 0.9 % IR SOLN
Status: DC | PRN
  Administered 2015-08-10: 1000 mL

## 2015-08-10 MED ORDER — LIDOCAINE HCL (CARDIAC) 20 MG/ML IV SOLN
INTRAVENOUS | Status: DC | PRN
  Administered 2015-08-10: 60 mg via INTRATRACHEAL

## 2015-08-10 MED ORDER — OXYCODONE HCL 5 MG PO TABS: 5.0000 mg | ORAL_TABLET | ORAL | Status: DC | PRN

## 2015-08-10 MED ORDER — METHOCARBAMOL 1000 MG/10ML IJ SOLN
500.0000 mg | Freq: Four times a day (QID) | INTRAVENOUS | Status: DC | PRN
  Filled 2015-08-10: qty 5

## 2015-08-10 MED ORDER — ENOXAPARIN SODIUM 40 MG/0.4ML ~~LOC~~ SOLN: 40.0000 mg | Freq: Every day | SUBCUTANEOUS | Status: DC

## 2015-08-10 MED ORDER — ROCURONIUM BROMIDE 100 MG/10ML IV SOLN
INTRAVENOUS | Status: DC | PRN
  Administered 2015-08-10: 50 mg via INTRAVENOUS

## 2015-08-10 MED ORDER — NEOSTIGMINE METHYLSULFATE 10 MG/10ML IV SOLN
INTRAVENOUS | Status: DC | PRN
  Administered 2015-08-10: 3 mg via INTRAVENOUS

## 2015-08-10 MED ORDER — PANTOPRAZOLE SODIUM 40 MG IV SOLR
40.0000 mg | INTRAVENOUS | Status: DC
  Administered 2015-08-11: 40 mg via INTRAVENOUS
  Filled 2015-08-10 (×2): qty 40

## 2015-08-10 MED ORDER — FENTANYL CITRATE (PF) 250 MCG/5ML IJ SOLN
INTRAMUSCULAR | Status: AC
  Filled 2015-08-10: qty 5

## 2015-08-10 MED ORDER — METOCLOPRAMIDE HCL 5 MG/ML IJ SOLN: 5.0000 mg | Freq: Three times a day (TID) | INTRAMUSCULAR | Status: DC | PRN

## 2015-08-10 MED ORDER — CEFAZOLIN SODIUM-DEXTROSE 2-3 GM-% IV SOLR
2.0000 g | Freq: Four times a day (QID) | INTRAVENOUS | Status: AC
  Administered 2015-08-10 – 2015-08-11 (×3): 2 g via INTRAVENOUS
  Filled 2015-08-10 (×3): qty 50

## 2015-08-10 MED ORDER — HYDROCODONE-ACETAMINOPHEN 5-325 MG PO TABS
1.0000 | ORAL_TABLET | Freq: Four times a day (QID) | ORAL | Status: DC | PRN
  Administered 2015-08-13: 2 via ORAL
  Filled 2015-08-10: qty 2

## 2015-08-10 MED ORDER — GLYCOPYRROLATE 0.2 MG/ML IJ SOLN
INTRAMUSCULAR | Status: DC | PRN
  Administered 2015-08-10: 0.6 mg via INTRAVENOUS

## 2015-08-10 MED ORDER — ACETAMINOPHEN 325 MG PO TABS: 650.0000 mg | ORAL_TABLET | Freq: Four times a day (QID) | ORAL | Status: DC | PRN

## 2015-08-10 MED ORDER — LACTATED RINGERS IV SOLN
INTRAVENOUS | Status: DC | PRN
  Administered 2015-08-10: 11:00:00 via INTRAVENOUS

## 2015-08-10 MED ORDER — 0.9 % SODIUM CHLORIDE (POUR BTL) OPTIME
TOPICAL | Status: DC | PRN
Start: 2015-08-10 — End: 2015-08-10
  Administered 2015-08-10: 1000 mL

## 2015-08-10 MED ORDER — MENTHOL 3 MG MT LOZG: 1.0000 | LOZENGE | OROMUCOSAL | Status: DC | PRN

## 2015-08-10 MED ORDER — FENTANYL CITRATE (PF) 100 MCG/2ML IJ SOLN: 25.0000 ug | INTRAMUSCULAR | Status: DC | PRN

## 2015-08-10 SURGICAL SUPPLY — 51 items
BLADE SAW SGTL 18X1.27X75 (BLADE) ×2 IMPLANT
BNDG COHESIVE 6X5 TAN STRL LF (GAUZE/BANDAGES/DRESSINGS) ×2 IMPLANT
CAPT HIP HEMI 2 ×2 IMPLANT
CELLS DAT CNTRL 66122 CELL SVR (MISCELLANEOUS) ×1 IMPLANT
COVER SURGICAL LIGHT HANDLE (MISCELLANEOUS) ×2 IMPLANT
DRAPE C-ARM 42X72 X-RAY (DRAPES) ×2 IMPLANT
DRAPE IMP U-DRAPE 54X76 (DRAPES) ×2 IMPLANT
DRAPE STERI IOBAN 125X83 (DRAPES) ×2 IMPLANT
DRAPE U-SHAPE 47X51 STRL (DRAPES) ×6 IMPLANT
DRESSING ALLEVYN LIFE SACRUM (GAUZE/BANDAGES/DRESSINGS) ×2 IMPLANT
DRSG AQUACEL AG ADV 3.5X10 (GAUZE/BANDAGES/DRESSINGS) IMPLANT
DRSG MEPILEX BORDER 4X8 (GAUZE/BANDAGES/DRESSINGS) ×2 IMPLANT
DURAPREP 26ML APPLICATOR (WOUND CARE) ×2 IMPLANT
ELECT BLADE 4.0 EZ CLEAN MEGAD (MISCELLANEOUS) ×2
ELECT REM PT RETURN 9FT ADLT (ELECTROSURGICAL) ×2
ELECTRODE BLDE 4.0 EZ CLN MEGD (MISCELLANEOUS) ×1 IMPLANT
ELECTRODE REM PT RTRN 9FT ADLT (ELECTROSURGICAL) ×1 IMPLANT
FACESHIELD WRAPAROUND (MASK) ×2 IMPLANT
GLOVE NEODERM STRL 7.5 LF PF (GLOVE) ×2 IMPLANT
GLOVE SURG NEODERM 7.5  LF PF (GLOVE) ×2
GLOVE SURG SYN 7.5  E (GLOVE) ×1
GLOVE SURG SYN 7.5 E (GLOVE) ×1 IMPLANT
GOWN SRG XL XLNG 56XLVL 4 (GOWN DISPOSABLE) ×1 IMPLANT
GOWN STRL NON-REIN XL XLG LVL4 (GOWN DISPOSABLE) ×1
GOWN STRL REUS W/ TWL LRG LVL3 (GOWN DISPOSABLE) IMPLANT
GOWN STRL REUS W/TWL LRG LVL3 (GOWN DISPOSABLE)
HANDPIECE INTERPULSE COAX TIP (DISPOSABLE) ×1
KIT BASIN OR (CUSTOM PROCEDURE TRAY) ×2 IMPLANT
MARKER SKIN DUAL TIP RULER LAB (MISCELLANEOUS) ×2 IMPLANT
PACK TOTAL JOINT (CUSTOM PROCEDURE TRAY) ×2 IMPLANT
PACK UNIVERSAL I (CUSTOM PROCEDURE TRAY) ×2 IMPLANT
PADDING CAST COTTON 6X4 STRL (CAST SUPPLIES) ×2 IMPLANT
RTRCTR WOUND ALEXIS 18CM MED (MISCELLANEOUS) ×2
SEALER BIPOLAR AQUA 6.0 (INSTRUMENTS) ×2 IMPLANT
SET HNDPC FAN SPRY TIP SCT (DISPOSABLE) ×1 IMPLANT
SOLUTION BETADINE 4OZ (MISCELLANEOUS) ×2 IMPLANT
STAPLER VISISTAT 35W (STAPLE) ×2 IMPLANT
SUT ETHIBOND 2 V 37 (SUTURE) ×2 IMPLANT
SUT ETHIBOND NAB CT1 #1 30IN (SUTURE) ×6 IMPLANT
SUT ETHILON 3 0 FSL (SUTURE) ×2 IMPLANT
SUT VIC AB 0 CT1 27 (SUTURE) ×1
SUT VIC AB 0 CT1 27XBRD ANBCTR (SUTURE) ×1 IMPLANT
SUT VIC AB 1 CT1 27 (SUTURE) ×1
SUT VIC AB 1 CT1 27XBRD ANBCTR (SUTURE) ×1 IMPLANT
SUT VIC AB 2-0 CT1 27 (SUTURE) ×2
SUT VIC AB 2-0 CT1 TAPERPNT 27 (SUTURE) ×2 IMPLANT
SYR 20CC LL (SYRINGE) ×2 IMPLANT
TOWEL OR 17X26 10 PK STRL BLUE (TOWEL DISPOSABLE) ×2 IMPLANT
TRAY FOLEY CATH 16FR SILVER (SET/KITS/TRAYS/PACK) IMPLANT
TUBE CONNECTING 12X1/4 (SUCTIONS) ×2 IMPLANT
YANKAUER SUCT BULB TIP NO VENT (SUCTIONS) ×2 IMPLANT

## 2015-08-10 NOTE — ED Notes (Signed)
Updated sharon, receiving RN, on patient status, pain medication administered and foley placed due to poor ability to follow direction. emt bringing patient up.

## 2015-08-10 NOTE — Discharge Instructions (Signed)
° ° °  1. Change dressings as needed °2. May shower but keep incisions covered and dry °3. Take lovenox to prevent blood clots °4. Take stool softeners as needed °5. Take pain meds as needed ° °

## 2015-08-10 NOTE — Anesthesia Postprocedure Evaluation (Signed)
Anesthesia Post Note  Patient: Connie Holder  Procedure(s) Performed: Procedure(s) (LRB): RIGHT HEMI-HIP ARTHROPLASTY ANTERIOR APPROACH (Right)  Patient location during evaluation: PACU Anesthesia Type: General Level of consciousness: awake and alert Pain management: pain level controlled Vital Signs Assessment: post-procedure vital signs reviewed and stable Respiratory status: spontaneous breathing, nonlabored ventilation and respiratory function stable Cardiovascular status: blood pressure returned to baseline and stable Postop Assessment: no signs of nausea or vomiting Anesthetic complications: no    Last Vitals:  Filed Vitals:   08/10/15 1349 08/10/15 1407  BP:    Pulse: 80   Temp:  36.4 C  Resp: 25     Last Pain:  Filed Vitals:   08/10/15 1407  PainSc: Asleep                 [name],W. EDMOND

## 2015-08-10 NOTE — Anesthesia Preprocedure Evaluation (Addendum)
Anesthesia Evaluation  Patient identified by MRN, date of birth, ID band Patient awake    Reviewed: Allergy & Precautions, H&P , NPO status , Patient's Chart, lab work & pertinent test results  Airway Mallampati: II  TM Distance: >3 FB Neck ROM: Full    Dental no notable dental hx. (+) Partial Lower, Dental Advisory Given   Pulmonary neg pulmonary ROS,    Pulmonary exam normal breath sounds clear to auscultation       Cardiovascular negative cardio ROS   Rhythm:Regular Rate:Normal     Neuro/Psych Dementia negative neurological ROS  negative psych ROS   GI/Hepatic negative GI ROS, Neg liver ROS,   Endo/Other  negative endocrine ROS  Renal/GU negative Renal ROS  negative genitourinary   Musculoskeletal  (+) Arthritis , Rheumatoid disorders,    Abdominal   Peds  Hematology negative hematology ROS (+)   Anesthesia Other Findings   Reproductive/Obstetrics negative OB ROS                            Anesthesia Physical Anesthesia Plan  ASA: II  Anesthesia Plan: General   Post-op Pain Management:    Induction: Intravenous  Airway Management Planned: Oral ETT  Additional Equipment:   Intra-op Plan:   Post-operative Plan: Extubation in OR  Informed Consent: I have reviewed the patients History and Physical, chart, labs and discussed the procedure including the risks, benefits and alternatives for the proposed anesthesia with the patient or authorized representative who has indicated his/her understanding and acceptance.   Dental advisory given  Plan Discussed with: CRNA  Anesthesia Plan Comments:         Anesthesia Quick Evaluation

## 2015-08-10 NOTE — ED Notes (Signed)
Attempted to assist to void with fracture pan, patient unable to follow directions.

## 2015-08-10 NOTE — Op Note (Signed)
RIGHT HEMI-HIP ARTHROPLASTY ANTERIOR APPROACH  Procedure Note Connie Holder   [id number]  Pre-op Diagnosis: right hip fracture     Post-op Diagnosis: same   Operative Procedures  1. Prosthetic replacement for femoral neck fracture. CPT [phone]  Personnel  Surgeon(s): Tarry Kos, MD   Anesthesia: general  Prosthesis: Depuy Femur: Corail KA 11 Head: 45 mm size: +1.5 Bearing Type: Bipolar  Date of Service: 08/09/2015 - 08/10/2015  Hip Hemiarthroplasty (Anterior Approach) Op Note:  After informed consent was obtained and the operative extremity marked in the holding area, the patient was brought back to the operating room and placed supine on the HANA table. Next, the operative extremity was prepped and draped in normal sterile fashion. Surgical timeout occurred verifying patient identification, surgical site, surgical procedure and administration of antibiotics.  A modified anterior Smith-Peterson approach to the hip was performed, using the interval between tensor fascia lata and sartorius.  Dissection was carried bluntly down onto the anterior hip capsule. The lateral femoral circumflex vessels were identified and coagulated. A capsulotomy was performed and the capsular flaps tagged for later repair.  Fluoroscopy was utilized to prepare for the femoral neck cut. The neck osteotomy was performed. The femoral head was removed and found a 45 mm head was the appropriate fit.    We then turned our attention to the femur.  After placing the femoral hook, the leg was taken to externally rotated, extended and adducted position taking care to perform soft tissue releases to allow for adequate mobilization of the femur. Soft tissue was cleared from the shoulder of the greater trochanter and the hook elevator used to improve exposure of the proximal femur. Sequential broaching performed up to a size 11. Trial neck and head were placed. The leg was brought back up to neutral and the construct reduced.  The position and sizing of components, offset and leg lengths were checked using fluoroscopy. Stability of the construct was checked in extension and external rotation without any subluxation or impingement of prosthesis. We dislocated the prosthesis, dropped the leg back into position, removed trial components, and irrigated copiously. The final stem and head was then placed, the leg brought back up, the system reduced and fluoroscopy used to verify positioning.  We irrigated, obtained hemostasis and closed the capsule using #2 ethibond suture.  The fascia was closed with #1 vicryl plus, the deep fat layer was closed with 0 vicryl, the subcutaneous layers closed with 2.0 Vicryl Plus and the skin closed with staples. A sterile dressing was applied. The patient was awakened in the operating room and taken to recovery in stable condition. All sponge, needle, and instrument counts were correct at the end of the case.   Position: supine  Complications: none.  Time Out: performed   Drains/Packing: none  Estimated blood loss: 200 cc  Returned to Recovery Room: in good condition.   Antibiotics: yes   Mechanical VTE (DVT) Prophylaxis: sequential compression devices, TED thigh-high  Chemical VTE (DVT) Prophylaxis: lovenox  Fluid Replacement: Crystalloid: see anesthesia record  Specimens Removed: 1 to pathology   Sponge and Instrument Count Correct? yes   PACU: portable radiograph - low AP   Admission: inpatient status, start PT & OT POD#1  Plan/RTC: Return in 2 weeks for staple removal. Return in 6 weeks to see MD.  Weight Bearing/Load Lower Extremity: full  Hip precautions: none Suture Removal: 10-14 days  Betadine to incision twice daily once dressing is removed on POD#7  [name], MD  [facility] [phone] 1:09 PM     Implant Name Type Inv. Item Serial No. Manufacturer Lot No. LRB No. Used  STEM CORAIL KA11 - [id number] Stem STEM CORAIL KA11  DEPUY [id number] Right 1   HIP BALL ARTICU DEPUY - [id number] Hips HIP BALL ARTICU DEPUY  DEPUY  Right 1  HIP BALL ARTICU DEPUY - [id number] Hips HIP BALL ARTICU DEPUY   DEPUY [id number] Right 1

## 2015-08-10 NOTE — Anesthesia Procedure Notes (Signed)
Procedure Name: Intubation Date/Time: 08/10/2015 11:40 AM Performed by: Alanda Amass A Pre-anesthesia Checklist: Patient identified, Emergency Drugs available, Suction available, Patient being monitored and Timeout performed Patient Re-evaluated:Patient Re-evaluated prior to inductionOxygen Delivery Method: Circle system utilized Preoxygenation: Pre-oxygenation with 100% oxygen Intubation Type: IV induction Ventilation: Mask ventilation without difficulty and Oral airway inserted - appropriate to patient size Laryngoscope Size: Glidescope Grade View: Grade I Tube type: Oral Tube size: 7.0 mm Number of attempts: 1 Airway Equipment and Method: Stylet and Video-laryngoscopy Placement Confirmation: ETT inserted through vocal cords under direct vision,  positive ETCO2 and breath sounds checked- equal and bilateral Secured at: 21 cm Tube secured with: Tape Dental Injury: Teeth and Oropharynx as per pre-operative assessment

## 2015-08-10 NOTE — Transfer of Care (Signed)
Immediate Anesthesia Transfer of Care Note  Patient: Connie Holder  Procedure(s) Performed: Procedure(s): RIGHT HEMI-HIP ARTHROPLASTY ANTERIOR APPROACH (Right)  Patient Location: PACU  Anesthesia Type:General  Level of Consciousness: awake  Airway & Oxygen Therapy: Patient Spontanous Breathing and Patient connected to face mask oxygen  Post-op Assessment: Report given to RN, Post -op Vital signs reviewed and stable and Patient moving all extremities  Post vital signs: Reviewed and stable  Last Vitals:  Filed Vitals:   08/10/15 0054 08/10/15 0431  BP: 146/76 112/96  Pulse: 71 96  Temp: 36.7 C 37.2 C  Resp: 18 18    Complications: No apparent anesthesia complications

## 2015-08-10 NOTE — ED Notes (Signed)
Verbal order from dr. Leretha Pol to place foley. 28ft placed with urine return.
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
## 2015-08-10 NOTE — Consult Note (Signed)
ORTHOPAEDIC CONSULTATION  REQUESTING PHYSICIAN: Osvaldo Shipper, MD  Chief Complaint: Right hip fracture  HPI: Connie Holder is a 81 y.o. female who presents with right hip fracture s/p mechanical fall.  The patient endorses severe pain in the right hip, that does not radiate, grinding in quality, worse with any movement, better with immobilization.  Denies LOC/fever/chills/nausea/vomiting.  Walks with assistive devices (walker, cane, wheelchair).  Does live independently with husband.  Past Medical History  Diagnosis Date  . RA (rheumatoid arthritis) (HCC)   . Dementia    Past Surgical History  Procedure Laterality Date  . Abdominal hysterectomy     Social History   Social History  . Marital Status: Married    Spouse Name: N/A  . Number of Children: N/A  . Years of Education: N/A   Social History Main Topics  . Smoking status: Never Smoker   . Smokeless tobacco: None  . Alcohol Use: No  . Drug Use: None  . Sexual Activity: Not Asked   Other Topics Concern  . None   Social History Narrative   History reviewed. No pertinent family history. Allergies  Allergen Reactions  . Aspirin Other (See Comments)  . Other Swelling    All nut products Mouth sores  . Peanut Oil Other (See Comments)   Prior to Admission medications   Medication Sig Start Date End Date Taking? Authorizing Provider  esomeprazole (NEXIUM) 40 MG capsule Take 1 capsule (40 mg total) by mouth daily. Patient not taking: Reported on 08/09/2015 11/26/14   Rolan Bucco, MD  Family history is negative for RA   Dg Chest 1 View  08/09/2015  CLINICAL DATA:  Severe right-sided hip and back pain, dementia, fell outside at 1630 hours when missed a step EXAM: CHEST 1 VIEW COMPARISON:  11/26/2014 FINDINGS: Enlargement of cardiac silhouette. Mediastinal contours and pulmonary vascularity normal. Atherosclerotic calcification aorta. Bronchitic changes with bibasilar atelectasis greater on LEFT. No acute  infiltrate, pleural effusion or pneumothorax. Diffuse osseous demineralization with scattered degenerative changes of the spine. IMPRESSION: Enlargement of cardiac silhouette. Bronchitic changes with bibasilar atelectasis. Electronically Signed   By: Ulyses Southward M.D.   On: 08/09/2015 23:45   Dg Hip Unilat  With Pelvis 2-3 Views Right  08/09/2015  CLINICAL DATA:  Right hip pain. EXAM: DG HIP (WITH OR WITHOUT PELVIS) 2-3V RIGHT COMPARISON:  None. FINDINGS: There is an acute subcapital femoral neck fracture involving the right hip. There is proximal displacement of the distal fracture fragments. No dislocation. IMPRESSION: 1. Acute subcapital fracture involves the right hip. Electronically Signed   By: Signa Kell M.D.   On: 08/09/2015 21:57   Dg Femur, Min 2 Views Right  08/09/2015  CLINICAL DATA:  Severe right hip and back pain. Larey Seat outside around 18 30. Patient was initially ambulatory but when she returned home developed increasing pain. EXAM: RIGHT FEMUR 2 VIEWS COMPARISON:  None. FINDINGS: There is a transverse fracture of the right femoral neck with superior displacement of the distal fracture fragment resulting in varus angulation. No dislocation at the hip joint. Visualized right hemipelvis appears intact. Femoral shaft and right knee appear intact with degenerative changes in the right knee. No destructive bone lesions. IMPRESSION: Acute transverse fracture of the right femoral neck with varus angulation. Electronically Signed   By: Burman Nieves M.D.   On: 08/09/2015 23:43   Xrays independently reviewed and interpreted  Positive ROS: All other systems have been reviewed and were otherwise negative with the exception of those mentioned  in the HPI and as above.  Physical Exam: General: Alert, no acute distress Cardiovascular: No pedal edema Respiratory: No cyanosis, no use of accessory musculature GI: No organomegaly, abdomen is soft and non-tender Skin: No lesions in the area of  chief complaint Neurologic: Sensation intact distally Psychiatric: Patient is competent for consent with normal mood and affect Lymphatic: No axillary or cervical lymphadenopathy  MUSCULOSKELETAL:  - severe pain with movement of the hip and extremity - skin intact - NVI distally - compartments soft  Assessment: Right hip fracture  Plan: - partial hip replacement is recommended, patient and family are aware of r/b/a and wish to proceed - consent obtained - medical optimization per primary team - surgery is planned for today  Thank you for the consult and the opportunity to see [name], MD [facility] [phone] 10:04 AM

## 2015-08-10 NOTE — Progress Notes (Signed)
[facility] PROGRESS NOTE  Connie Holder TKZ:[id number] DOB: 11-09-1929 DOA: 08/09/2015  PCP: Malka So., MD  Brief HPI: 81 year old Caucasian female with past medical history of rheumatoid arthritis, dementia, presented after a mechanical fall resulting in fracture of her right hip. She was hospitalized for further management.  Past medical history:  Past Medical History  Diagnosis Date  . RA (rheumatoid arthritis) (HCC)   . Dementia     Consultants: Orthopedics  Procedures: Plan is for surgery for right hip fracture today  Antibiotics: None  Subjective: Patient has dementia. Does not answer any of my questions. She is confused. Patient's husband is at bedside along with other family members. They mention that patient has been complaining of back pain.   Objective: Vital Signs  Filed Vitals:   08/10/15 0015 08/10/15 0030 08/10/15 0054 08/10/15 0431  BP: 140/83 142/68 146/76 112/96  Pulse: 71 71 71 96  Temp:   98.1 F (36.7 C) 98.9 F (37.2 C)  TempSrc:   Oral Oral  Resp:   18 18  Height:   5\' 3"  (1.6 m)   Weight:   57.561 kg (126 lb 14.4 oz)   SpO2: 99% 98% 99% 91%    Intake/Output Summary (Last 24 hours) at 08/10/15 1316 Last data filed at 08/10/15 1315  Gross per 24 hour  Intake   1400 ml  Output    550 ml  Net    850 ml   Filed Weights   08/10/15 0054  Weight: 57.561 kg (126 lb 14.4 oz)    General appearance: alert, appears stated age, distracted and no distress Cardio: regular rate and rhythm, S1, S2 normal, no murmur, click, rub or gallop GI: soft, non-tender; bowel sounds normal; no masses,  no organomegaly Extremities: Right lower extremity is externally rotated. Able to lift her left lower extremity without much difficulty. She is able to move her neck without any difficulty. Able to move her arms. Neurologic: Alert. Distracted. Disoriented. No obvious focal deficits.  Lab Results:  Basic Metabolic Panel:  Recent Labs Lab  08/09/15 2237 08/10/15 0455  NA 139 139  K 3.8 4.2  CL 106 108  CO2 24 23  GLUCOSE 149* 147*  BUN 14 11  CREATININE 1.04* 0.92  CALCIUM 8.8* 8.5*   CBC:  Recent Labs Lab 08/09/15 2237 08/10/15 0455  WBC 15.3* 13.0*  NEUTROABS 12.8*  --   HGB 12.4 11.4*  HCT 38.5 35.0*  MCV 90.4 90.2  PLT 192 171    Recent Results (from the past 240 hour(s))  MRSA PCR Screening     Status: None   Collection Time: 08/10/15 10:10 AM  Result Value Ref Range Status   MRSA by PCR NEGATIVE NEGATIVE Final    Comment:        The GeneXpert MRSA Assay (FDA approved for NASAL specimens only), is one component of a comprehensive MRSA colonization surveillance program. It is not intended to diagnose MRSA infection nor to guide or monitor treatment for MRSA infections.       Studies/Results: Dg Chest 1 View  08/09/2015  CLINICAL DATA:  Severe right-sided hip and back pain, dementia, fell outside at 1630 hours when missed a step EXAM: CHEST 1 VIEW COMPARISON:  11/26/2014 FINDINGS: Enlargement of cardiac silhouette. Mediastinal contours and pulmonary vascularity normal. Atherosclerotic calcification aorta. Bronchitic changes with bibasilar atelectasis greater on LEFT. No acute infiltrate, pleural effusion or pneumothorax. Diffuse osseous demineralization with scattered degenerative changes of the spine. IMPRESSION: Enlargement of cardiac silhouette.  Bronchitic changes with bibasilar atelectasis. Electronically Signed   By: Ulyses Southward M.D.   On: 08/09/2015 23:45   Dg Hip Operative Unilat With Pelvis Right  08/10/2015  CLINICAL DATA:  Patient status post right hip hemiarthroplasty. EXAM: OPERATIVE RIGHT HIP (WITH PELVIS IF PERFORMED) 2 VIEWS TECHNIQUE: Fluoroscopic spot image(s) were submitted for interpretation post-operatively. COMPARISON:  Hip radiograph 08/09/2015 FINDINGS: Two intraoperative fluoroscopic images of the right hip were submitted for interpretation. These demonstrate the patient to  be post right hip hemiarthroplasty. No definite evidence for acute abnormality. IMPRESSION: Postoperative changes right hip. Electronically Signed   By: Annia Belt M.D.   On: 08/10/2015 13:13   Dg Hip Unilat  With Pelvis 2-3 Views Right  08/09/2015  CLINICAL DATA:  Right hip pain. EXAM: DG HIP (WITH OR WITHOUT PELVIS) 2-3V RIGHT COMPARISON:  None. FINDINGS: There is an acute subcapital femoral neck fracture involving the right hip. There is proximal displacement of the distal fracture fragments. No dislocation. IMPRESSION: 1. Acute subcapital fracture involves the right hip. Electronically Signed   By: Signa Kell M.D.   On: 08/09/2015 21:57   Dg Femur, Min 2 Views Right  08/09/2015  CLINICAL DATA:  Severe right hip and back pain. Larey Seat outside around 18 30. Patient was initially ambulatory but when she returned home developed increasing pain. EXAM: RIGHT FEMUR 2 VIEWS COMPARISON:  None. FINDINGS: There is a transverse fracture of the right femoral neck with superior displacement of the distal fracture fragment resulting in varus angulation. No dislocation at the hip joint. Visualized right hemipelvis appears intact. Femoral shaft and right knee appear intact with degenerative changes in the right knee. No destructive bone lesions. IMPRESSION: Acute transverse fracture of the right femoral neck with varus angulation. Electronically Signed   By: Burman Nieves M.D.   On: 08/09/2015 23:43    Medications:  Scheduled: . [MAR Hold] pantoprazole (PROTONIX) IV  40 mg Intravenous Q24H   Continuous: . sodium chloride 75 mL/hr at 08/10/15 0102  . lactated ringers 10 mL/hr at 08/10/15 1038   PRN:0.9 % irrigation (POUR BTL), [MAR Hold] acetaminophen **OR** [MAR Hold] acetaminophen, [MAR Hold] alum & mag hydroxide-simeth, [MAR Hold]  HYDROmorphone (DILAUDID) injection, [MAR Hold]  morphine injection, [MAR Hold] oxyCODONE, sodium chloride irrigation  Assessment/Plan:  Active Problems:   Closed right  hip fracture (HCC)   Fall at home   RA (rheumatoid arthritis) (HCC)   Dementia    Right hip fracture Patient to go to the OR shortly. Medical consultation by Dr. Lovell Sheehan at the time of admission. There is some mention of back pain. It's quite possible that the fracture is causing referred pain to the back. She does not seem to have any weakness in her lower extremities. Her dementia makes it difficult to get accurate history. We will need to fully assess her after her hip surgery. If her back pain persists, she may need imaging studies to evaluate the pain.  History of dementia Appears to be at baseline. Continue to monitor. Reorient daily. Anticipate some worsening in mental status due to surgery and hospitalization. Check UA  History of rheumatoid arthritis Stable  Mild hyperglycemia No known history of diabetes. Monitor for now.  Normocytic anemia Likely due to hemodilution. Monitor for now  Leukocytosis Likely due to acute stress. No obvious source of infection. Continue to monitor.  DVT Prophylaxis: Definitive prophylaxis post operatively. Per orthopedics    Code Status: Full code  Family Communication: Discussed with the patient's husband  Disposition  Plan: Await surgery.    LOS: 1 day   [facility]  [facility] Pager [phone] 08/10/2015, 1:16 PM  If 7PM-7AM, please contact night-coverage at [email or web address], password [facility]

## 2015-08-11 DIAGNOSIS — F0391 Unspecified dementia with behavioral disturbance: Secondary | ICD-10-CM

## 2015-08-11 DIAGNOSIS — D62 Acute posthemorrhagic anemia: Secondary | ICD-10-CM

## 2015-08-11 LAB — CBC
HEMATOCRIT: 29.7 % — AB (ref 36.0–46.0)
HEMOGLOBIN: 9.5 g/dL — AB (ref 12.0–15.0)
MCH: 29.1 pg (ref 26.0–34.0)
MCHC: 32 g/dL (ref 30.0–36.0)
MCV: 91.1 fL (ref 78.0–100.0)
Platelets: 135 10*3/uL — ABNORMAL LOW (ref 150–400)
RBC: 3.26 MIL/uL — AB (ref 3.87–5.11)
RDW: 14.7 % (ref 11.5–15.5)
WBC: 15.1 10*3/uL — AB (ref 4.0–10.5)

## 2015-08-11 LAB — URINALYSIS, ROUTINE W REFLEX MICROSCOPIC
Bilirubin Urine: NEGATIVE
GLUCOSE, UA: 100 mg/dL — AB
Ketones, ur: NEGATIVE mg/dL
Leukocytes, UA: NEGATIVE
Nitrite: NEGATIVE
PH: 5.5 (ref 5.0–8.0)
PROTEIN: NEGATIVE mg/dL
Specific Gravity, Urine: 1.015 (ref 1.005–1.030)

## 2015-08-11 LAB — URINE MICROSCOPIC-ADD ON

## 2015-08-11 LAB — BASIC METABOLIC PANEL
ANION GAP: 5 (ref 5–15)
BUN: 9 mg/dL (ref 6–20)
CALCIUM: 7.9 mg/dL — AB (ref 8.9–10.3)
CHLORIDE: 105 mmol/L (ref 101–111)
CO2: 25 mmol/L (ref 22–32)
Creatinine, Ser: 1.01 mg/dL — ABNORMAL HIGH (ref 0.44–1.00)
GFR calc non Af Amer: 50 mL/min — ABNORMAL LOW (ref >60)
GFR, EST AFRICAN AMERICAN: 58 mL/min — AB (ref >60)
Glucose, Bld: 162 mg/dL — ABNORMAL HIGH (ref 65–99)
POTASSIUM: 3.9 mmol/L (ref 3.5–5.1)
Sodium: 135 mmol/L (ref 135–145)

## 2015-08-11 MED ORDER — HALOPERIDOL LACTATE 5 MG/ML IJ SOLN: 1.0000 mg | Freq: Four times a day (QID) | INTRAMUSCULAR | Status: DC | PRN

## 2015-08-11 MED ORDER — QUETIAPINE 12.5 MG HALF TABLET
12.5000 mg | ORAL_TABLET | Freq: Every day | ORAL | Status: DC
  Administered 2015-08-12 (×2): 12.5 mg via ORAL
  Filled 2015-08-11 (×4): qty 1

## 2015-08-11 NOTE — Progress Notes (Addendum)
OT Cancellation Note  Patient Details Name: Connie Holder MRN: [id number] DOB: [birth year]   Cancelled Treatment:    Reason Eval/Treat Not Completed:  (OT screened) Current D/C plan is SNF. No apparent immediate acute care OT needs, therefore will defer OT to SNF. If OT eval is needed please call Acute Rehab Dept. at [phone] or text page OT at [phone].     Earlie Raveling OTR/L [phone] 08/11/2015, 4:38 PM

## 2015-08-11 NOTE — Progress Notes (Signed)
[facility] PROGRESS NOTE  Connie Holder UKG:[id number] DOB: June 01, 1930 DOA: 08/09/2015  PCP: Malka So., MD  Brief HPI: 81 year old Caucasian female with past medical history of rheumatoid arthritis, dementia, presented after a mechanical fall resulting in fracture of her right hip. She was hospitalized for further management.  Past medical history:  Past Medical History  Diagnosis Date  . RA (rheumatoid arthritis) (HCC)   . Dementia     Consultants: Orthopedics  Procedures: Prosthetic replacement for right femoral neck fracture 12/17  Antibiotics: None  Subjective: Patient was apparently quite agitated this morning. She refused blood draw. She has not been very cooperative with nursing staff. She remains disoriented and confused this morning.  Objective: Vital Signs  Filed Vitals:   08/10/15 2040 08/10/15 2300 08/11/15 0300 08/11/15 0700  BP: 119/77   97/50  Pulse: 72   100  Temp: 98 F (36.7 C)     TempSrc: Axillary     Resp: 18  18 18   Height:  5\' 6"  (1.676 m)    Weight:  54.432 kg (120 lb)    SpO2: 90%       Intake/Output Summary (Last 24 hours) at 08/11/15 1400 Last data filed at 08/11/15 0600  Gross per 24 hour  Intake 2334.58 ml  Output    700 ml  Net 1634.58 ml   Filed Weights   08/10/15 0054 08/10/15 2300  Weight: 57.561 kg (126 lb 14.4 oz) 54.432 kg (120 lb)    General appearance: alert, distracted. Confused. Somewhat agitated.  Cardio: regular rate and rhythm, S1, S2 normal, no murmur, click, rub or gallop GI: soft, non-tender; bowel sounds normal; no masses,  no organomegaly Extremities: No obvious bruising noted in the right thigh. Lopid to be moving her left leg without any difficulty. Neurologic: Alert. Distracted. Disoriented. No obvious focal deficits. Moving all her extremities.  Lab Results:  Basic Metabolic Panel:  Recent Labs Lab 08/09/15 2237 08/10/15 0455 08/11/15 1022  NA 139 139 135  K 3.8 4.2 3.9  CL 106 108  105  CO2 24 23 25   GLUCOSE 149* 147* 162*  BUN 14 11 9   CREATININE 1.04* 0.92 1.01*  CALCIUM 8.8* 8.5* 7.9*   CBC:  Recent Labs Lab 08/09/15 2237 08/10/15 0455 08/11/15 1022  WBC 15.3* 13.0* 15.1*  NEUTROABS 12.8*  --   --   HGB 12.4 11.4* 9.5*  HCT 38.5 35.0* 29.7*  MCV 90.4 90.2 91.1  PLT 192 171 135*    Recent Results (from the past 240 hour(s))  MRSA PCR Screening     Status: None   Collection Time: 08/10/15 10:10 AM  Result Value Ref Range Status   MRSA by PCR NEGATIVE NEGATIVE Final    Comment:        The GeneXpert MRSA Assay (FDA approved for NASAL specimens only), is one component of a comprehensive MRSA colonization surveillance program. It is not intended to diagnose MRSA infection nor to guide or monitor treatment for MRSA infections.       Studies/Results: Dg Chest 1 View  08/09/2015  CLINICAL DATA:  Severe right-sided hip and back pain, dementia, fell outside at 1630 hours when missed a step EXAM: CHEST 1 VIEW COMPARISON:  11/26/2014 FINDINGS: Enlargement of cardiac silhouette. Mediastinal contours and pulmonary vascularity normal. Atherosclerotic calcification aorta. Bronchitic changes with bibasilar atelectasis greater on LEFT. No acute infiltrate, pleural effusion or pneumothorax. Diffuse osseous demineralization with scattered degenerative changes of the spine. IMPRESSION: Enlargement of cardiac silhouette. Bronchitic changes with  bibasilar atelectasis. Electronically Signed   By: Ulyses Southward M.D.   On: 08/09/2015 23:45   Pelvis Portable  08/10/2015  CLINICAL DATA:  Patient status post hip replacement. EXAM: PORTABLE PELVIS 1-2 VIEWS COMPARISON:  Earlier same day FINDINGS: Surgical staple line overlies the proximal right lower extremity. Patient status post right hip arthroplasty. Hardware appears in appropriate position. No evidence for acute abnormality. IMPRESSION: Patient status post right hip arthroplasty. Electronically Signed   By: Annia Belt  M.D.   On: 08/10/2015 14:21   Dg Hip Operative Unilat With Pelvis Right  08/10/2015  CLINICAL DATA:  Patient status post right hip hemiarthroplasty. EXAM: OPERATIVE RIGHT HIP (WITH PELVIS IF PERFORMED) 2 VIEWS TECHNIQUE: Fluoroscopic spot image(s) were submitted for interpretation post-operatively. COMPARISON:  Hip radiograph 08/09/2015 FINDINGS: Two intraoperative fluoroscopic images of the right hip were submitted for interpretation. These demonstrate the patient to be post right hip hemiarthroplasty. No definite evidence for acute abnormality. IMPRESSION: Postoperative changes right hip. Electronically Signed   By: Annia Belt M.D.   On: 08/10/2015 13:13   Dg Hip Unilat  With Pelvis 2-3 Views Right  08/09/2015  CLINICAL DATA:  Right hip pain. EXAM: DG HIP (WITH OR WITHOUT PELVIS) 2-3V RIGHT COMPARISON:  None. FINDINGS: There is an acute subcapital femoral neck fracture involving the right hip. There is proximal displacement of the distal fracture fragments. No dislocation. IMPRESSION: 1. Acute subcapital fracture involves the right hip. Electronically Signed   By: Signa Kell M.D.   On: 08/09/2015 21:57   Dg Femur, Min 2 Views Right  08/09/2015  CLINICAL DATA:  Severe right hip and back pain. Larey Seat outside around 18 30. Patient was initially ambulatory but when she returned home developed increasing pain. EXAM: RIGHT FEMUR 2 VIEWS COMPARISON:  None. FINDINGS: There is a transverse fracture of the right femoral neck with superior displacement of the distal fracture fragment resulting in varus angulation. No dislocation at the hip joint. Visualized right hemipelvis appears intact. Femoral shaft and right knee appear intact with degenerative changes in the right knee. No destructive bone lesions. IMPRESSION: Acute transverse fracture of the right femoral neck with varus angulation. Electronically Signed   By: Burman Nieves M.D.   On: 08/09/2015 23:43    Medications:  Scheduled: . enoxaparin  (LOVENOX) injection  40 mg Subcutaneous Q24H  . pantoprazole (PROTONIX) IV  40 mg Intravenous Q24H  . QUEtiapine  12.5 mg Oral QHS   Continuous: . sodium chloride 50 mL/hr at 08/11/15 1001   URK:[id number] **OR** acetaminophen, alum & mag hydroxide-simeth, haloperidol lactate, HYDROcodone-acetaminophen, HYDROmorphone (DILAUDID) injection, menthol-cetylpyridinium **OR** phenol, methocarbamol **OR** methocarbamol (ROBAXIN)  IV, metoCLOPramide **OR** metoCLOPramide (REGLAN) injection, morphine injection, ondansetron **OR** ondansetron (ZOFRAN) IV, oxyCODONE  Assessment/Plan:  Active Problems:   Closed right hip fracture (HCC)   Fall at home   RA (rheumatoid arthritis) (HCC)   Dementia    Right hip fracture Patient is status post hip replacement for her to fracture. Orthopedics is following. PT and OT to be initiated. Does not appear to be in any pain at this time. Yesterday there was some concern about back pain. It's quite possible that the fracture was causing referred pain to the back. She does not seem to have any weakness in her lower extremities. Her dementia makes it difficult to get accurate history. Continue to monitor for now.  History of dementia with Agitation Patient agitated this morning. Seems somewhat calm when I evaluated her. We will order Haldol as needed. Seroquel at  nighttime. Reorient daily. Some worsening in mental status expected due to surgery, anesthesia, and hospital stay. Check UA. EKG could not be done as the patient was not cooperative.  History of rheumatoid arthritis Stable  Mild hyperglycemia No known history of diabetes. Monitor for now.  Anemia due to operative loss Drop in hemoglobin, most likely due to operative loss. Tinea to monitor for now.  Leukocytosis Likely due to acute stress. No obvious source of infection. Continue to monitor. UA is pending.  DVT Prophylaxis: Started on enoxaparin  Code Status: Full code  Family Communication:  Discussed with the patient's husband  Disposition Plan: Await PT and OT. Will most likely need SNF placement.    LOS: 2 days   [facility]  [facility] Pager [phone] 08/11/2015, 2:00 PM  If 7PM-7AM, please contact night-coverage at [email or web address], password [facility]

## 2015-08-11 NOTE — Progress Notes (Signed)
Pt awakened for vital signs. She became very violent, screaming and trying to hit staff and husband. Staff was unable to touch pt because she was swinging her arms and trying to hit [place]. Unable to set up scds, pulse ox, or get some of vital signs because of her violent behavior. Hospitalist on call notified.

## 2015-08-11 NOTE — Progress Notes (Signed)
   Subjective:  Patient is confused.  Objective:   VITALS:   Filed Vitals:   08/10/15 2040 08/10/15 2300 08/11/15 0300 08/11/15 0700  BP: 119/77   97/50  Pulse: 72   100  Temp: 98 F (36.7 C)     TempSrc: Axillary     Resp: 18  18 18   Height:  5\' 6"  (1.676 m)    Weight:  54.432 kg (120 lb)    SpO2: 90%       ABD soft Neurovascular intact Sensation intact distally Intact pulses distally Dorsiflexion/Plantar flexion intact Incision: dressing C/D/I and no drainage No cellulitis present Compartment soft   Lab Results  Component Value Date   WBC 13.0* 08/10/2015   HGB 11.4* 08/10/2015   HCT 35.0* 08/10/2015   MCV 90.2 08/10/2015   PLT 171 08/10/2015     Assessment/Plan:  1 Day Post-Op   - Expected postop acute blood loss anemia - will monitor for symptoms - Up with PT/OT - SNF - DVT ppx - SCDs, ambulation, lovenox - WBAT operative extremity - Pain control - Discharge planning per hospitalist  08/12/2015 08/11/2015, 9:54 AM [phone] ]

## 2015-08-11 NOTE — Progress Notes (Signed)
Utilization Review Completed.Connie Holder T12/18/2016  

## 2015-08-11 NOTE — Evaluation (Signed)
Physical Therapy Evaluation Patient Details Name: Connie Holder MRN: [id number] DOB: July 15, 1930 Today's Date: 08/11/2015   History of Present Illness  81 y.o female admitted to [facility] on 08/09/15 s/p fall with resultant right hip fx s/p R direct anterior hip hemiarthroplasty.  She has been having some post op confusion.  Pt with significant PMHx of dementia and RA    Clinical Impression  Pt was able to get up this PM with the assist of the PT and the RN.  She took some pivotal steps with the RW, but did reqire two people for this to be effective.  She has no idea that she has had hip surgery and was found in the bed trying to pull out her catheter after having already removed the dressing from her incision.  She is most appropriate at this time for SNF placement at discharge.      Follow Up Recommendations SNF    Equipment Recommendations  Rolling walker with 5" wheels    Recommendations for Other Services   NA    Precautions / Restrictions Precautions Precautions: Fall Restrictions RLE Weight Bearing: Weight bearing as tolerated      Mobility  Bed Mobility Overal bed mobility: Needs Assistance Bed Mobility: Supine to Sit     Supine to sit: Mod assist     General bed mobility comments: Mod assist to help boost trunk and progress hips to EOB.  Assist also provided to help progress right leg to EOB.   Transfers Overall transfer level: Needs assistance Equipment used: Rolling walker (2 wheeled) Transfers: Sit to/from [employer] Sit to Stand: +2 physical assistance;Min assist Stand pivot transfers: +2 physical assistance;Min assist       General transfer comment: Two person min assist to get to standing.  When attempted with one, became two person mod assist from lower recliner chair vs. higher bed.   Ambulation/Gait Ambulation/Gait assistance: +2 physical assistance;Min assist Ambulation Distance (Feet): 5 Feet Assistive device: Rolling walker (2  wheeled) Gait Pattern/deviations: Step-to pattern;Antalgic     General Gait Details: pt took several pivotal steps to the recliner chair with the RW.  Two person min assist to support trunk and ensure slow descent to recliner chair. Assist needed to help maneuver RW and support pt for balance and help pt unweight when stepping on right foot.                      Balance Overall balance assessment: Needs assistance Sitting-balance support: Feet supported;Bilateral upper extremity supported Sitting balance-Leahy Scale: Fair     Standing balance support: Bilateral upper extremity supported Standing balance-Leahy Scale: Poor                               Pertinent Vitals/Pain Pain Assessment: Faces Faces Pain Scale: Hurts little more Pain Location: right hip Pain Descriptors / Indicators: Grimacing;Guarding Pain Intervention(s): Limited activity within patient's tolerance;Monitored during session;Repositioned    Home Living Family/patient expects to be discharged to:: Skilled nursing facility Living Arrangements: Spouse/significant other Connie Holder)                    Prior Function           Comments: unable to assess as pt canot report and spouse is not in room.         Extremity/Trunk Assessment   Upper Extremity Assessment: Generalized weakness  Lower Extremity Assessment: RLE deficits/detail RLE Deficits / Details: right leg with normal post op pain and weakness.  Pt with at least 3/5 ankle, 2+/5 knee, and 2/5 hip per gross functional assessment.     Cervical / Trunk Assessment: Normal  Communication   Communication: No difficulties  Cognition Arousal/Alertness: Awake/alert Behavior During Therapy: Restless Overall Cognitive Status: Impaired/Different from baseline Area of Impairment: Orientation;Attention;Memory;Following commands;Safety/judgement;Awareness;Problem solving Orientation Level:  Place;Time;Situation;Disoriented to Current Attention Level: Focused Memory: Decreased recall of precautions;Decreased short-term memory Following Commands: Follows one step commands inconsistently Safety/Judgement: Decreased awareness of safety;Decreased awareness of deficits Awareness: Intellectual Problem Solving: Difficulty sequencing;Requires verbal cues;Requires tactile cues General Comments: Pt thinks she is at home, unaware of her hip surgery, knows her husband's name.             Assessment/Plan    PT Assessment Patient needs continued PT services  PT Diagnosis Difficulty walking;Abnormality of gait;Generalized weakness;Acute pain   PT Problem List Decreased range of motion;Decreased strength;Decreased activity tolerance;Decreased balance;Decreased mobility;Decreased cognition;Decreased knowledge of use of DME;Decreased safety awareness;Pain  PT Treatment Interventions DME instruction;Gait training;Stair training;Functional mobility training;Therapeutic exercise;Therapeutic activities;Balance training;Neuromuscular re-education;Patient/family education;Cognitive remediation;Manual techniques;Modalities   PT Goals (Current goals can be found in the Care Plan section) Acute Rehab PT Goals Patient Stated Goal: unable to state, but at the time verbalizing she wants to get up OOB.  PT Goal Formulation: Patient unable to participate in goal setting Time For Goal Achievement: 08/18/15 Potential to Achieve Goals: Good    Frequency Min 3X/week           End of Session   Activity Tolerance: Patient limited by pain Patient left: in chair;with call bell/phone within reach;with chair alarm set Nurse Communication: Other (comment) (RN helping with session)         Time:  -      Charges:   PT Evaluation $Initial PT Evaluation Tier I: 1 Procedure          Connie Holder Test, PT, DPT [phone]   08/11/2015, 6:50 PM

## 2015-08-11 NOTE — Progress Notes (Signed)
PT Cancellation Note  Patient Details Name: Connie Holder MRN: [id number] DOB: 28-Apr-1930   Cancelled Treatment:    Reason Eval/Treat Not Completed: Patient declined, no reason specified.  Pt confused, agitated.  Despite husband and therapists coaxing, she is adamant that she does not want to get up at this time.  "I just want to sleep!  Won't anyone let me sleep!"  RN made aware that PT will try to check back later today if time allows.   Thanks,    Rollene Rotunda. Devrin Monforte, PT, DPT [phone]   08/11/2015, 11:50 AM

## 2015-08-12 ENCOUNTER — Encounter (HOSPITAL_COMMUNITY): Payer: Self-pay | Admitting: Orthopaedic Surgery

## 2015-08-12 DIAGNOSIS — R509 Fever, unspecified: Secondary | ICD-10-CM

## 2015-08-12 DIAGNOSIS — D649 Anemia, unspecified: Secondary | ICD-10-CM | POA: Diagnosis present

## 2015-08-12 LAB — CBC
HCT: 26.7 % — ABNORMAL LOW (ref 36.0–46.0)
HEMOGLOBIN: 8.5 g/dL — AB (ref 12.0–15.0)
MCH: 28.9 pg (ref 26.0–34.0)
MCHC: 31.8 g/dL (ref 30.0–36.0)
MCV: 90.8 fL (ref 78.0–100.0)
PLATELETS: 144 10*3/uL — AB (ref 150–400)
RBC: 2.94 MIL/uL — ABNORMAL LOW (ref 3.87–5.11)
RDW: 14.8 % (ref 11.5–15.5)
WBC: 12.1 10*3/uL — ABNORMAL HIGH (ref 4.0–10.5)

## 2015-08-12 LAB — BASIC METABOLIC PANEL
Anion gap: 4 — ABNORMAL LOW (ref 5–15)
BUN: 11 mg/dL (ref 6–20)
CALCIUM: 7.7 mg/dL — AB (ref 8.9–10.3)
CHLORIDE: 107 mmol/L (ref 101–111)
CO2: 25 mmol/L (ref 22–32)
CREATININE: 1.02 mg/dL — AB (ref 0.44–1.00)
GFR, EST AFRICAN AMERICAN: 57 mL/min — AB (ref >60)
GFR, EST NON AFRICAN AMERICAN: 49 mL/min — AB (ref >60)
Glucose, Bld: 125 mg/dL — ABNORMAL HIGH (ref 65–99)
Potassium: 3.8 mmol/L (ref 3.5–5.1)
SODIUM: 136 mmol/L (ref 135–145)

## 2015-08-12 MED ORDER — PANTOPRAZOLE SODIUM 40 MG PO TBEC
40.0000 mg | DELAYED_RELEASE_TABLET | Freq: Every day | ORAL | Status: DC
  Administered 2015-08-13: 40 mg via ORAL
  Filled 2015-08-12: qty 1

## 2015-08-12 MED ORDER — HALOPERIDOL LACTATE 5 MG/ML IJ SOLN
1.0000 mg | Freq: Once | INTRAMUSCULAR | Status: AC
  Administered 2015-08-12: 1 mg via INTRAVENOUS
  Filled 2015-08-12: qty 1

## 2015-08-12 MED ORDER — ACETAMINOPHEN 325 MG PO TABS: 650.0000 mg | ORAL_TABLET | Freq: Four times a day (QID) | ORAL | Status: AC | PRN

## 2015-08-12 MED ORDER — QUETIAPINE FUMARATE 25 MG PO TABS: 12.5000 mg | ORAL_TABLET | Freq: Every day | ORAL | Status: AC

## 2015-08-12 MED ORDER — HALOPERIDOL 0.5 MG PO TABS
0.5000 mg | ORAL_TABLET | Freq: Three times a day (TID) | ORAL | Status: DC | PRN
  Filled 2015-08-12 (×2): qty 1

## 2015-08-12 MED ORDER — METHOCARBAMOL 500 MG PO TABS: 500.0000 mg | ORAL_TABLET | Freq: Four times a day (QID) | ORAL | Status: AC | PRN

## 2015-08-12 NOTE — Care Management Important Message (Signed)
Important Message  Patient Details  Name: Connie Holder MRN: [id number] Date of Birth: 10-May-1930   Medicare Important Message Given:  Yes    Oralia Rud Emmerie Battaglia 08/12/2015, 3:34 PM

## 2015-08-12 NOTE — Progress Notes (Signed)
Patient became combative with Beth, NT while attempting to assist to bedside commode.  Neither husband or Ladean Raya, RN unable to console patient.  MD notified.  MD on telephone as patient was combative and yelling.  Unable to assist patient from sitting on the side of the bed to a lying position without patient becoming combative to RN, NT or husband.  Haldol given; RN and NT able to get patient back in the bed.  See MAR for orders.

## 2015-08-12 NOTE — Progress Notes (Signed)
   Subjective:  Patient is much more alert today.  Sitting up eating banana.  Objective:   VITALS:   Filed Vitals:   08/11/15 1746 08/11/15 2020 08/12/15 0100 08/12/15 0529  BP:  94/48  97/44  Pulse:  79  79  Temp: 99.1 F (37.3 C) 98.1 F (36.7 C)  99 F (37.2 C)  TempSrc:  Oral  Oral  Resp:  16 16 16   Height:      Weight:      SpO2:  94%  96%    ABD soft Neurovascular intact Sensation intact distally Intact pulses distally Dorsiflexion/Plantar flexion intact Incision: dressing C/D/I and no drainage No cellulitis present Compartment soft   Lab Results  Component Value Date   WBC 12.1* 08/12/2015   HGB 8.5* 08/12/2015   HCT 26.7* 08/12/2015   MCV 90.8 08/12/2015   PLT 144* 08/12/2015     Assessment/Plan:  2 Days Post-Op   - Expected postop acute blood loss anemia - will monitor for symptoms - Up with PT/OT - SNF - DVT ppx - SCDs, ambulation, lovenox - WBAT operative extremity - SNF pending  08/14/2015 08/12/2015, 9:49 AM [phone]

## 2015-08-12 NOTE — Clinical Social Work Placement (Signed)
   CLINICAL SOCIAL WORK PLACEMENT  NOTE  Date:  08/12/2015  Patient Details  Name: Connie Holder MRN: [id number] Date of Birth: [birth year]  Clinical Social Work is seeking post-discharge placement for this patient at the Skilled  Nursing Facility level of care (*CSW will initial, date and re-position this form in  chart as items are completed):  Yes   Patient/family provided with [facility] Clinical Social Work Department's list of facilities offering this level of care within the geographic area requested by the patient (or if unable, by the patient's family).  Yes   Patient/family informed of their freedom to choose among providers that offer the needed level of care, that participate in Medicare, Medicaid or managed care program needed by the patient, have an available bed and are willing to accept the patient.  Yes   Patient/family informed of [facility]'s ownership interest in [facility] and [facility], as well as of the fact that they are under no obligation to receive care at these facilities.  PASRR submitted to EDS on 08/12/15     PASRR number received on 08/12/15     Existing PASRR number confirmed on       FL2 transmitted to all facilities in geographic area requested by pt/family on 08/12/15     FL2 transmitted to all facilities within larger geographic area on       Patient informed that his/her managed care company has contracts with or will negotiate with certain facilities, including the following:        Yes   Patient/family informed of bed offers received.  Patient chooses bed at  [facility])     Physician recommends and patient chooses bed at      Patient to be transferred to  [facility]) on  .  Patient to be transferred to facility by [facility]     Patient family notified on   of transfer.  Name of family member notified:        PHYSICIAN Please sign FL2     Additional Comment:     _______________________________________________ Rod Mae, LCSW 08/12/2015, 3:21 PM

## 2015-08-12 NOTE — NC FL2 (Signed)
[place] MEDICAID FL2 LEVEL OF CARE SCREENING TOOL     IDENTIFICATION  Patient Name: Connie Holder Birthdate: 1930-04-23 Sex: female Admission Date (Current Location): 08/09/2015  [facility] and [place] Number:     Facility and Address:  [facility], [address] [place], [place] [address]      Provider Number: [id number]  Attending Physician Name and Address:  Osvaldo Shipper, MD  Relative Name and Phone Number:       Current Level of Care: Hospital Recommended Level of Care: Skilled Nursing Facility Prior Approval Number:    Date Approved/Denied:   PASRR Number: [id number]  Discharge Plan: SNF    Current Diagnoses: Patient Active Problem List   Diagnosis Date Noted  . Closed right hip fracture (HCC) 08/09/2015  . Fall at home 08/09/2015  . RA (rheumatoid arthritis) (HCC) 08/09/2015  . Dementia 08/09/2015    Orientation RESPIRATION BLADDER Height & Weight    Self  Normal Continent 5\' 6"  (167.6 cm) 120 lbs.  BEHAVIORAL SYMPTOMS/MOOD NEUROLOGICAL BOWEL NUTRITION STATUS  Other (Comment) (n/a)  (n/a) Continent Diet (Please see discharge summary.)  AMBULATORY STATUS COMMUNICATION OF NEEDS Skin   Limited Assist Verbally Surgical wounds                       Personal Care Assistance Level of Assistance  Bathing, Feeding, Dressing Bathing Assistance: Maximum assistance Feeding assistance: Independent Dressing Assistance: Maximum assistance     Functional Limitations Info   (n/a)          SPECIAL CARE FACTORS FREQUENCY  PT (By licensed PT), OT (By licensed OT)     PT Frequency: 5 OT Frequency: 5            Contractures      Additional Factors Info  Code Status, Allergies Code Status Info: FULL Allergies Info: Aspirin, All nut products, Peanut Oil           Current Medications (08/12/2015):  This is the current hospital active medication list Current Facility-Administered Medications  Medication Dose Route  Frequency Provider Last Rate Last Dose  . 0.9 %  sodium chloride infusion   Intravenous Continuous 08/14/2015, MD 50 mL/hr at 08/11/15 2000    . acetaminophen (TYLENOL) tablet 650 mg  650 mg Oral Q6H PRN 08/13/15, MD   650 mg at 08/11/15 1454   Or  . acetaminophen (TYLENOL) suppository 650 mg  650 mg Rectal Q6H PRN Naiping 08/13/15, MD      . alum & mag hydroxide-simeth (MAALOX/MYLANTA) 200-200-20 MG/5ML suspension 30 mL  30 mL Oral Q6H PRN 03-02-2001, MD      . enoxaparin (LOVENOX) injection 40 mg  40 mg Subcutaneous Q24H Naiping Ron Parker, MD   40 mg at 08/12/15 08/14/15  . haloperidol (HALDOL) tablet 0.5 mg  0.5 mg Oral Q8H PRN 2831, MD      . HYDROcodone-acetaminophen (NORCO/VICODIN) 5-325 MG per tablet 1-2 tablet  1-2 tablet Oral Q6H PRN Osvaldo Shipper, MD      . HYDROmorphone (DILAUDID) injection 0.5-1 mg  0.5-1 mg Intravenous Q3H PRN Tarry Kos, MD   1 mg at 08/10/15 2355  . menthol-cetylpyridinium (CEPACOL) lozenge 3 mg  1 lozenge Oral PRN Naiping 2356, MD       Or  . phenol (CHLORASEPTIC) mouth spray 1 spray  1 spray Mouth/Throat PRN Naiping Donnelly Stager, MD      . methocarbamol (ROBAXIN) tablet  500 mg  500 mg Oral Q6H PRN Naiping Donnelly Stager, MD       Or  . methocarbamol (ROBAXIN) 500 mg in dextrose 5 % 50 mL IVPB  500 mg Intravenous Q6H PRN Naiping Donnelly Stager, MD      . metoCLOPramide (REGLAN) tablet 5-10 mg  5-10 mg Oral Q8H PRN Naiping Donnelly Stager, MD       Or  . metoCLOPramide (REGLAN) injection 5-10 mg  5-10 mg Intravenous Q8H PRN Naiping Donnelly Stager, MD      . morphine 2 MG/ML injection 0.5 mg  0.5 mg Intravenous Q2H PRN Naiping Donnelly Stager, MD      . ondansetron [facility]) tablet 4 mg  4 mg Oral Q6H PRN Naiping Donnelly Stager, MD       Or  . ondansetron [facility]) injection 4 mg  4 mg Intravenous Q6H PRN Naiping Donnelly Stager, MD      . oxyCODONE (Oxy IR/ROXICODONE) immediate release tablet 5-10 mg  5-10 mg Oral Q4H PRN Tarry Kos, MD   10 mg at 08/12/15 0328  . pantoprazole (PROTONIX) EC tablet 40 mg  40 mg Oral Daily  Lynita Lombard Shannon, [facility]      . QUEtiapine (SEROQUEL) tablet 12.5 mg  12.5 mg Oral QHS Osvaldo Shipper, MD   12.5 mg at 08/12/15 0406     Discharge Medications: Please see discharge summary for a list of discharge medications.  Relevant Imaging Results:  Relevant Lab Results:   Additional Information Social Security #: [id number]  Rojelio Brenner [phone]

## 2015-08-12 NOTE — Clinical Social Work Note (Signed)
Clinical Social Work Assessment  Patient Details  Name: Connie Holder MRN: [id number] Date of Birth: 05/23/1930  Date of referral:  08/12/15               Reason for consult:  Facility Placement, Discharge Planning                Permission sought to share information with:  Facility Medical sales representative, Family Supports Permission granted to share information::  Yes, Verbal Permission Granted  Name::     Ayani Ospina  Agency::  [employer]  Relationship::  Husband  Contact Information:  [phone]  Housing/Transportation Living arrangements for the past 2 months:  Single Family Home Source of Information:  Spouse Patient Interpreter Needed:  None Criminal Activity/Legal Involvement Pertinent to Current Situation/Hospitalization:  No - Comment as needed Significant Relationships:  Spouse Lives with:  Spouse Do you feel safe going back to the place where you live?  No (High fall risk.) Need for family participation in patient care:  Yes (Comment) (Patient's husband active in patient's care.)  Care giving concerns:  Patient's husband expressed no concerns at this time.   Social Worker assessment / plan:  CSW received referral for possible SNF placement at time of discharge. CSW spoke with patient's husband who informed CSW patient's husband would prefer for patient to be discharged to [facility] in [place], [place]. CSW contacted preferred facility regarding possible placement. [facility] able to accept patient once patient medically stable for discharge. CSW to continue to follow and assist with discharge planning needs.  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  Patient/Family's Understanding of and Emotional Response to Diagnosis, Current Treatment, and Prognosis:  Patient's husband understanding and agreeable to CSW plan of care.  Emotional Assessment Appearance:  Other (Comment Required (Patient oriented to self only, CSW spoke with patient's husband.) Attitude/Demeanor/Rapport:  Other (Patient oriented to self only, CSW spoke with patient's husband.) Affect (typically observed):  Other (Patient oriented to self only, CSW spoke with patient's husband.) Orientation:  Oriented to Self Alcohol / Substance use:  Not Applicable Psych involvement (Current and /or in the community):  No (Comment) (Not appropriate on this admission.)  Discharge Needs  Concerns to be addressed:  No discharge needs identified Readmission within the last 30 days:  No Current discharge risk:  None Barriers to Discharge:  No Barriers Identified   Rod Mae, LCSW 08/12/2015, 3:17 PM [phone]

## 2015-08-12 NOTE — Progress Notes (Signed)
Pt awakened in a very pleasant mood. She was talkative and amiable.  She was able to tell us that she needed to void and then ambulated with two person assist to bedside commode.

## 2015-08-12 NOTE — Progress Notes (Signed)
[facility] PROGRESS NOTE  Kately Graffam KVQ:[id number] DOB: 16-Sep-1929 DOA: 08/09/2015  PCP: Malka So., MD  Brief HPI: 81 year old Caucasian female with past medical history of rheumatoid arthritis, dementia, presented after a mechanical fall resulting in fracture of her right hip. She was hospitalized for further management.  Past medical history:  Past Medical History  Diagnosis Date  . RA (rheumatoid arthritis) (HCC)   . Dementia     Consultants: Orthopedics  Procedures: Prosthetic replacement for right femoral neck fracture 12/17  Antibiotics: None  Subjective: Patient was sleepy when I evaluated her this morning. Her husband was at the bedside. Patient was easily arousable. Apparently, her mood was better yesterday afternoon. However, subsequently I was called by the nurse that the patient was once again agitated. This resolved after she was given Haldol.   Objective: Vital Signs  Filed Vitals:   08/11/15 1746 08/11/15 2020 08/12/15 0100 08/12/15 0529  BP:  94/48  97/44  Pulse:  79  79  Temp: 99.1 F (37.3 C) 98.1 F (36.7 C)  99 F (37.2 C)  TempSrc:  Oral  Oral  Resp:  16 16 16   Height:      Weight:      SpO2:  94%  96%    Intake/Output Summary (Last 24 hours) at 08/12/15 0920 Last data filed at 08/12/15 0400  Gross per 24 hour  Intake 1241.25 ml  Output    750 ml  Net 491.25 ml   Filed Weights   08/10/15 0054 08/10/15 2300  Weight: 57.561 kg (126 lb 14.4 oz) 54.432 kg (120 lb)    General appearance: Sleepy but arousable.  Cardio: regular rate and rhythm, S1, S2 normal, no murmur, click, rub or gallop GI: soft, non-tender; bowel sounds normal; no masses,  no organomegaly Extremities: No bruising noted in the right thigh. Appears to be moving her left leg without any difficulty. Neurologic: Alert. Distracted. Disoriented. No obvious focal deficits. Moving all her extremities.  Lab Results:  Basic Metabolic Panel:  Recent Labs Lab  08/09/15 2237 08/10/15 0455 08/11/15 1022 08/12/15 0543  NA 139 139 135 136  K 3.8 4.2 3.9 3.8  CL 106 108 105 107  CO2 24 23 25 25   GLUCOSE 149* 147* 162* 125*  BUN 14 11 9 11   CREATININE 1.04* 0.92 1.01* 1.02*  CALCIUM 8.8* 8.5* 7.9* 7.7*   CBC:  Recent Labs Lab 08/09/15 2237 08/10/15 0455 08/11/15 1022 08/12/15 0543  WBC 15.3* 13.0* 15.1* 12.1*  NEUTROABS 12.8*  --   --   --   HGB 12.4 11.4* 9.5* 8.5*  HCT 38.5 35.0* 29.7* 26.7*  MCV 90.4 90.2 91.1 90.8  PLT 192 171 135* 144*    Recent Results (from the past 240 hour(s))  MRSA PCR Screening     Status: None   Collection Time: 08/10/15 10:10 AM  Result Value Ref Range Status   MRSA by PCR NEGATIVE NEGATIVE Final    Comment:        The GeneXpert MRSA Assay (FDA approved for NASAL specimens only), is one component of a comprehensive MRSA colonization surveillance program. It is not intended to diagnose MRSA infection nor to guide or monitor treatment for MRSA infections.       Studies/Results: Pelvis Portable  08/10/2015  CLINICAL DATA:  Patient status post hip replacement. EXAM: PORTABLE PELVIS 1-2 VIEWS COMPARISON:  Earlier same day FINDINGS: Surgical staple line overlies the proximal right lower extremity. Patient status post right hip arthroplasty. Hardware appears  in appropriate position. No evidence for acute abnormality. IMPRESSION: Patient status post right hip arthroplasty. Electronically Signed   By: Annia Belt M.D.   On: 08/10/2015 14:21   Dg Hip Operative Unilat With Pelvis Right  08/10/2015  CLINICAL DATA:  Patient status post right hip hemiarthroplasty. EXAM: OPERATIVE RIGHT HIP (WITH PELVIS IF PERFORMED) 2 VIEWS TECHNIQUE: Fluoroscopic spot image(s) were submitted for interpretation post-operatively. COMPARISON:  Hip radiograph 08/09/2015 FINDINGS: Two intraoperative fluoroscopic images of the right hip were submitted for interpretation. These demonstrate the patient to be post right hip  hemiarthroplasty. No definite evidence for acute abnormality. IMPRESSION: Postoperative changes right hip. Electronically Signed   By: Annia Belt M.D.   On: 08/10/2015 13:13    Medications:  Scheduled: . enoxaparin (LOVENOX) injection  40 mg Subcutaneous Q24H  . pantoprazole (PROTONIX) IV  40 mg Intravenous Q24H  . QUEtiapine  12.5 mg Oral QHS   Continuous: . sodium chloride 50 mL/hr at 08/11/15 2000   XTK:[id number] **OR** acetaminophen, alum & mag hydroxide-simeth, HYDROcodone-acetaminophen, HYDROmorphone (DILAUDID) injection, menthol-cetylpyridinium **OR** phenol, methocarbamol **OR** methocarbamol (ROBAXIN)  IV, metoCLOPramide **OR** metoCLOPramide (REGLAN) injection, morphine injection, ondansetron **OR** ondansetron (ZOFRAN) IV, oxyCODONE  Assessment/Plan:  Active Problems:   Closed right hip fracture (HCC)   Fall at home   RA (rheumatoid arthritis) (HCC)   Dementia    Right hip fracture Patient is status post hip replacement for her hip fracture. Orthopedics is following. SNF has been recommended. Patient was experiencing some back pain as well, but due to her dementia, it was difficult to obtain specific information from her. It appears that that pain was most likely due to referred pain from her hip fracture. No neurological deficits noted. I raised her left leg up with no discomfort whatsoever. She also was able to ambulate to the bathroom. No further testing.   History of dementia with Agitation Patient's agitation appears to have improved some with Seroquel. Continue for now. Haldol as needed. UA was unremarkable for infection. Patient does have underlying dementia which is the most likely reason for her agitation. Some worsening in mental status expected due to surgery, anesthesia, and hospital stay. EKG could not be done as the patient was not cooperative.  Fever/leukocytosis Patient noted have a temperature of 101F yesterday. No further episodes of fever. No obvious  infectious source. Likely due to recent surgery. Continue to monitor for now off antibiotics. WBC is improving. UA is negative for infection.  History of rheumatoid arthritis Stable  Mild hyperglycemia No known history of diabetes. Monitor for now.  Anemia due to operative loss Drop in hemoglobin, most likely due to operative loss. Continue to monitor for now.   DVT Prophylaxis: enoxaparin  Code Status: Full code  Family Communication: Discussed with the patient's husband  Disposition Plan: Anticipate discharge to SNF tomorrow.    LOS: 3 days   [facility]  [facility] Pager [phone] 08/12/2015, 9:20 AM  If 7PM-7AM, please contact night-coverage at [email or web address], password [facility]

## 2015-08-12 NOTE — Progress Notes (Signed)
Patient stated that she had to urinate but declined use of bedside commode or bedpan.  No incontinence observed.  Bladder scan performed showed 320 cc of urine.  MD made aware, informed to continue to monitor.  No new orders for intermittent cat.  Will continue to monitor patient.

## 2015-08-13 LAB — CBC
HCT: 26.4 % — ABNORMAL LOW (ref 36.0–46.0)
Hemoglobin: 8.6 g/dL — ABNORMAL LOW (ref 12.0–15.0)
MCH: 29.6 pg (ref 26.0–34.0)
MCHC: 32.6 g/dL (ref 30.0–36.0)
MCV: 90.7 fL (ref 78.0–100.0)
Platelets: 150 10*3/uL (ref 150–400)
RBC: 2.91 MIL/uL — ABNORMAL LOW (ref 3.87–5.11)
RDW: 14.7 % (ref 11.5–15.5)
WBC: 9.1 10*3/uL (ref 4.0–10.5)

## 2015-08-13 MED ORDER — PANTOPRAZOLE SODIUM 40 MG PO TBEC: 40.0000 mg | DELAYED_RELEASE_TABLET | Freq: Every day | ORAL | Status: DC

## 2015-08-13 MED ORDER — HALOPERIDOL 0.5 MG PO TABS: 0.5000 mg | ORAL_TABLET | Freq: Three times a day (TID) | ORAL | Status: AC | PRN

## 2015-08-13 NOTE — Discharge Summary (Signed)
[facility]  Physician Discharge Summary   Patient ID: Connie Holder MRN: [id number] DOB/AGE: 09/28/1929 81 y.o.  Admit date: 08/09/2015 Discharge date: 08/13/2015  PCP: Malka So., MD  DISCHARGE DIAGNOSES:  Active Problems:   Closed right hip fracture (HCC)   Fall at home   RA (rheumatoid arthritis) (HCC)   Dementia   Normocytic anemia   RECOMMENDATIONS FOR OUTPATIENT FOLLOW UP: 1. CBC and BMET in 1 week 2. EKG before end of week to check QT interval while on Haldol and Seroquel 3. Monitor CBG's periodically.   DISCHARGE CONDITION: fair  Diet recommendation: Regular  Filed Weights   08/10/15 0054 08/10/15 2300  Weight: 57.561 kg (126 lb 14.4 oz) 54.432 kg (120 lb)    INITIAL HISTORY: 81 year old Caucasian female with past medical history of rheumatoid arthritis, dementia, presented after a mechanical fall resulting in fracture of her right hip. She was hospitalized for further management.  Consultants: Orthopedics  Procedures: Prosthetic replacement for right femoral neck fracture 12/17  HOSPITAL COURSE:   Right hip fracture Patient is status post hip replacement for her hip fracture. Orthopedics was following. Patient seen by PT and SNF has been recommended. Patient was experiencing some back pain as well, but due to her dementia, it was difficult to obtain specific information from her. It appears that that pain was most likely due to referred pain from her hip fracture. No neurological deficits noted. I raised her left leg up with no discomfort whatsoever. She also was able to ambulate to the bathroom. No further testing.   History of dementia with Agitation Patient's agitation appears to have improved with Seroquel. Haldol as needed. UA was unremarkable for infection. Patient does have underlying dementia which is the most likely reason for her agitation. Some worsening in mental status was expected due to surgery, anesthesia, and hospital stay. EKG  could not be done as the patient was not cooperative. This can be pursued at the SNF to periodically check QT interval.   Fever/leukocytosis Patient noted have a temperature of 101F 12/18. No further episodes of fever. No obvious infectious source. Likely due to recent surgery. WBC is normal. UA is negative for infection.  History of rheumatoid arthritis Stable  Mild hyperglycemia No known history of diabetes. Monitor periodically for now.  Anemia due to operative loss Drop in hemoglobin, most likely due to operative loss. Recheck at SNF.  Cardiomegaly noted on CXR. Due to dementia patient not candidate for cardiac testing. Monitor clinically.  Overall stable. Agitation has improved and was due to dementia. Ok for discharge to SNF.   PERTINENT LABS:  The results of significant diagnostics from this hospitalization (including imaging, microbiology, ancillary and laboratory) are listed below for reference.    Microbiology: Recent Results (from the past 240 hour(s))  MRSA PCR Screening     Status: None   Collection Time: 08/10/15 10:10 AM  Result Value Ref Range Status   MRSA by PCR NEGATIVE NEGATIVE Final    Comment:        The GeneXpert MRSA Assay (FDA approved for NASAL specimens only), is one component of a comprehensive MRSA colonization surveillance program. It is not intended to diagnose MRSA infection nor to guide or monitor treatment for MRSA infections.      Labs: Basic Metabolic Panel:  Recent Labs Lab 08/09/15 2237 08/10/15 0455 08/11/15 1022 08/12/15 0543  NA 139 139 135 136  K 3.8 4.2 3.9 3.8  CL 106 108 105 107  CO2 25  GLUCOSE 149* 147* 162* 125*  BUN CREATININE 1.04* 0.92 1.01* 1.02*  CALCIUM 8.8* 8.5* 7.9* 7.7*   CBC:  Recent Labs Lab 08/09/15 2237 08/10/15 0455 08/11/15 1022 08/12/15 0543 08/13/15 0730  WBC 15.3* 13.0* 15.1* 12.1* 9.1  NEUTROABS 12.8*  --   --   --   --   HGB 12.4 11.4* 9.5* 8.5* 8.6*  HCT  38.5 35.0* 29.7* 26.7* 26.4*  MCV 90.4 90.2 91.1 90.8 90.7  PLT 192 171 135* 144* 150    IMAGING STUDIES Dg Chest 1 View  08/09/2015  CLINICAL DATA:  Severe right-sided hip and back pain, dementia, fell outside at 1630 hours when missed a step EXAM: CHEST 1 VIEW COMPARISON:  11/26/2014 FINDINGS: Enlargement of cardiac silhouette. Mediastinal contours and pulmonary vascularity normal. Atherosclerotic calcification aorta. Bronchitic changes with bibasilar atelectasis greater on LEFT. No acute infiltrate, pleural effusion or pneumothorax. Diffuse osseous demineralization with scattered degenerative changes of the spine. IMPRESSION: Enlargement of cardiac silhouette. Bronchitic changes with bibasilar atelectasis. Electronically Signed   By: Ulyses Southward M.D.   On: 08/09/2015 23:45   Pelvis Portable  08/10/2015  CLINICAL DATA:  Patient status post hip replacement. EXAM: PORTABLE PELVIS 1-2 VIEWS COMPARISON:  Earlier same day FINDINGS: Surgical staple line overlies the proximal right lower extremity. Patient status post right hip arthroplasty. Hardware appears in appropriate position. No evidence for acute abnormality. IMPRESSION: Patient status post right hip arthroplasty. Electronically Signed   By: Annia Belt M.D.   On: 08/10/2015 14:21   Dg Hip Operative Unilat With Pelvis Right  08/10/2015  CLINICAL DATA:  Patient status post right hip hemiarthroplasty. EXAM: OPERATIVE RIGHT HIP (WITH PELVIS IF PERFORMED) 2 VIEWS TECHNIQUE: Fluoroscopic spot image(s) were submitted for interpretation post-operatively. COMPARISON:  Hip radiograph 08/09/2015 FINDINGS: Two intraoperative fluoroscopic images of the right hip were submitted for interpretation. These demonstrate the patient to be post right hip hemiarthroplasty. No definite evidence for acute abnormality. IMPRESSION: Postoperative changes right hip. Electronically Signed   By: Annia Belt M.D.   On: 08/10/2015 13:13   Dg Hip Unilat  With Pelvis 2-3 Views  Right  08/09/2015  CLINICAL DATA:  Right hip pain. EXAM: DG HIP (WITH OR WITHOUT PELVIS) 2-3V RIGHT COMPARISON:  None. FINDINGS: There is an acute subcapital femoral neck fracture involving the right hip. There is proximal displacement of the distal fracture fragments. No dislocation. IMPRESSION: 1. Acute subcapital fracture involves the right hip. Electronically Signed   By: Signa Kell M.D.   On: 08/09/2015 21:57   Dg Femur, Min 2 Views Right  08/09/2015  CLINICAL DATA:  Severe right hip and back pain. Larey Seat outside around 18 30. Patient was initially ambulatory but when she returned home developed increasing pain. EXAM: RIGHT FEMUR 2 VIEWS COMPARISON:  None. FINDINGS: There is a transverse fracture of the right femoral neck with superior displacement of the distal fracture fragment resulting in varus angulation. No dislocation at the hip joint. Visualized right hemipelvis appears intact. Femoral shaft and right knee appear intact with degenerative changes in the right knee. No destructive bone lesions. IMPRESSION: Acute transverse fracture of the right femoral neck with varus angulation. Electronically Signed   By: Burman Nieves M.D.   On: 08/09/2015 23:43    DISCHARGE EXAMINATION: Filed Vitals:   08/12/15 0529 08/12/15 1259 08/12/15 2002 08/13/15 0548  BP: 97/44 115/58 128/61 112/58  Pulse: 79 85 95 93  Temp: 99 F (37.2 C) 98.8 F (37.1 C) 98.5  F (36.9 C) 98.3 F (36.8 C)  TempSrc: Oral  Oral Oral  Resp: 16 16 16 16   Height:      Weight:      SpO2: 96% 95% 93% 95%   General appearance: alert, appears stated age and distracted Resp: clear to auscultation bilaterally Cardio: regular rate and rhythm, S1, S2 normal, no murmur, click, rub or gallop GI: soft, non-tender; bowel sounds normal; no masses,  no organomegaly Extremities: dressing over right lateral thigh. No bruising.  DISPOSITION: SNF  Discharge Instructions    Call MD for:  difficulty breathing, headache or visual  disturbances    Complete by:  As directed      Call MD for:  extreme fatigue    Complete by:  As directed      Call MD for:  persistant dizziness or light-headedness    Complete by:  As directed      Call MD for:  persistant nausea and vomiting    Complete by:  As directed      Call MD for:  redness, tenderness, or signs of infection (pain, swelling, redness, odor or green/yellow discharge around incision site)    Complete by:  As directed      Call MD for:  severe uncontrolled pain    Complete by:  As directed      Call MD for:  temperature >100.4    Complete by:  As directed      Diet general    Complete by:  As directed      Discharge instructions    Complete by:  As directed   Please get EKG to check QT interval before end of this week. Please get CBC and BMET in 1 week.  You were cared for by a hospitalist during your hospital stay. If you have any questions about your discharge medications or the care you received while you were in the hospital after you are discharged, you can call the unit and asked to speak with the hospitalist on call if the hospitalist that took care of you is not available. Once you are discharged, your primary care physician will handle any further medical issues. Please note that NO REFILLS for any discharge medications will be authorized once you are discharged, as it is imperative that you return to your primary care physician (or establish a relationship with a primary care physician if you do not have one) for your aftercare needs so that they can reassess your need for medications and monitor your lab values. If you do not have a primary care physician, you can call [phone] for a physician referral.     Increase activity slowly    Complete by:  As directed      Weight bearing as tolerated    Complete by:  As directed            ALLERGIES:  Allergies  Allergen Reactions  . Aspirin Other (See Comments)  . Other Swelling    All nut products Mouth  sores  . Peanut Oil Other (See Comments)     Current Discharge Medication List    START taking these medications   Details  acetaminophen (TYLENOL) 325 MG tablet Take 2 tablets (650 mg total) by mouth every 6 (six) hours as needed for mild pain (or Fever >/= 101).    enoxaparin (LOVENOX) 40 MG/0.4ML injection Inject 0.4 mLs (40 mg total) into the skin daily. Qty: 14 Syringe, Refills: 0    haloperidol (HALDOL) 0.5  MG tablet Take 1 tablet (0.5 mg total) by mouth every 8 (eight) hours as needed for agitation. Qty: 30 tablet, Refills: 0    methocarbamol (ROBAXIN) 500 MG tablet Take 1 tablet (500 mg total) by mouth every 6 (six) hours as needed for muscle spasms. Qty: 30 tablet, Refills: 0    oxyCODONE (OXY IR/ROXICODONE) 5 MG immediate release tablet Take 1-3 tablets (5-15 mg total) by mouth every 4 (four) hours as needed. Qty: 90 tablet, Refills: 0    pantoprazole (PROTONIX) 40 MG tablet Take 1 tablet (40 mg total) by mouth daily.    QUEtiapine (SEROQUEL) 25 MG tablet Take 0.5 tablets (12.5 mg total) by mouth at bedtime. Qty: 30 tablet, Refills: 0      STOP taking these medications     esomeprazole (NEXIUM) 40 MG capsule        Follow-up Information    Follow up with Cheral Almas, MD In 2 weeks.   Specialty:  Orthopedic Surgery   Why:  For suture removal, For wound re-check   Contact information:   [address] [place] [address] [phone]       Follow up with JOBE,DANIEL B., MD. Schedule an appointment as soon as possible for a visit in 1 week.   Specialty:  Internal Medicine   Why:  post hospitalization follow up   Contact information:   [address] [place] [address] [phone]       TOTAL DISCHARGE TIME: 35 mins  [facility]  [facility] Pager [phone]  08/13/2015, 8:57 AM

## 2015-08-13 NOTE — Clinical Social Work Placement (Signed)
   CLINICAL SOCIAL WORK PLACEMENT  NOTE  Date:  08/13/2015  Patient Details  Name: Connie Holder MRN: [id number] Date of Birth: 1930/06/11  Clinical Social Work is seeking post-discharge placement for this patient at the Skilled  Nursing Facility level of care (*CSW will initial, date and re-position this form in  chart as items are completed):  Yes   Patient/family provided with [facility] Clinical Social Work Department's list of facilities offering this level of care within the geographic area requested by the patient (or if unable, by the patient's family).  Yes   Patient/family informed of their freedom to choose among providers that offer the needed level of care, that participate in Medicare, Medicaid or managed care program needed by the patient, have an available bed and are willing to accept the patient.  Yes   Patient/family informed of [facility]'s ownership interest in [facility] and [facility], as well as of the fact that they are under no obligation to receive care at these facilities.  PASRR submitted to EDS on 08/12/15     PASRR number received on 08/12/15     Existing PASRR number confirmed on       FL2 transmitted to all facilities in geographic area requested by pt/family on 08/12/15     FL2 transmitted to all facilities within larger geographic area on       Patient informed that his/her managed care company has contracts with or will negotiate with certain facilities, including the following:        Yes   Patient/family informed of bed offers received.  Patient chooses bed at  [facility])     Physician recommends and patient chooses bed at      Patient to be transferred to  [facility]) on 08/13/15.  Patient to be transferred to facility by [facility]     Patient family notified on 08/13/15 of transfer.  Name of family member notified:  Message left for husband, Carrianne Hyun     PHYSICIAN Please sign FL2     Additional Comment:     _______________________________________________ Rod Mae, LCSW 08/13/2015, 11:07 AM

## 2015-08-13 NOTE — Care Management Note (Signed)
Case Management Note  Patient Details  Name: Connie Holder MRN: [id number] Date of Birth: 27-Dec-1929  Subjective/Objective:          Admitted with rt hip fracture, s/p hip replacement         Action/Plan: PT/OT recommended SNF, referral made to CSW, per CSW patient to discharge to [facility] when medically ready.   Expected Discharge Date:                  Expected Discharge Plan:  Skilled Nursing Facility  In-House Referral:  Clinical Social Work  Discharge planning Services  CM Consult  Post Acute Care Choice:  NA Choice offered to:     DME Arranged:    DME Agency:     HH Arranged:    HH Agency:     Status of Service:  Completed, signed off  Medicare Important Message Given:  Yes Date Medicare IM Given:    Medicare IM give by:    Date Additional Medicare IM Given:    Additional Medicare Important Message give by:     If discussed at Long Length of Stay Meetings, dates discussed:    Additional Comments:  Monica Becton, RN 08/13/2015, 8:54 AM

## 2015-08-13 NOTE — Discharge Planning (Signed)
Patient to be discharged to [facility]. CSW left message with patient's husband regarding discharge.  [facility] Medicare SNF authorization received on 08/13/2015: [id number] Next review date: 08/15/2015 RUG level: RVB  Facility: [facility] RN report number: [phone] Transportation: [place], [place] [phone] Orthopedics: [phone] Surgical: [phone]

## 2015-08-13 NOTE — Progress Notes (Signed)
Physical Therapy Treatment Patient Details Name: Connie Holder MRN: [id number] DOB: [birth year] Today's Date: 08/13/2015    History of Present Illness 81 y.o female admitted to [facility] on 08/09/15 s/p fall with resultant right hip fx s/p R direct anterior hip hemiarthroplasty.  She has been having some post op confusion.  Pt with significant PMHx of dementia and RA      PT Comments    Good progress today. Able to ambulate up to 15 feet with min assist for walker control, bearing majority of weight through affected extremity without buckling or overt loss of balance. Tolerated therapeutic exercises well. Patient will continue to benefit from skilled physical therapy services to further improve independence with functional mobility.   Follow Up Recommendations  SNF     Equipment Recommendations  Rolling walker with 5" wheels    Recommendations for Other Services       Precautions / Restrictions Precautions Precautions: Fall Restrictions Weight Bearing Restrictions: Yes RLE Weight Bearing: Weight bearing as tolerated    Mobility  Bed Mobility Overal bed mobility: Needs Assistance Bed Mobility: Supine to Sit     Supine to sit: Min assist     General bed mobility comments: Min assist for RLE support out of bed. Able to use rail appropriately. Cues for technique.  Transfers Overall transfer level: Needs assistance Equipment used: Rolling walker (2 wheeled) Transfers: Sit to/from [employer] Sit to Stand: Min assist Stand pivot transfers: Min assist       General transfer comment: Min assist for boost to stand. VC for hand placement and assist for walker control with pivot to [facility]. Bearing majority of weight tough RLE without buckling. Cues for sequencing throughout.  Ambulation/Gait Ambulation/Gait assistance: Min assist Ambulation Distance (Feet): 15 Feet Assistive device: Rolling walker (2 wheeled) Gait Pattern/deviations: Step-to pattern;Step-through  pattern;Decreased step length - left;Decreased stance time - right;Decreased stride length;Antalgic Gait velocity: decreased Gait velocity interpretation: Below normal speed for age/gender General Gait Details: Min assist for walker control, esp with turns. Cues keep taking steps due to distraction at times. No buckling noted. Demonstrates good weight-bearing through RLE. Moderately antalgic.   Stairs            Wheelchair Mobility    Modified Rankin (Stroke Patients Only)       Balance                                    Cognition Arousal/Alertness: Awake/alert Behavior During Therapy: WFL for tasks assessed/performed Overall Cognitive Status: Impaired/Different from baseline Area of Impairment: Orientation;Attention;Memory;Following commands;Safety/judgement;Awareness;Problem solving Orientation Level: Place;Time;Situation;Disoriented to Current Attention Level: Focused Memory: Decreased recall of precautions;Decreased short-term memory Following Commands: Follows one step commands consistently;Follows one step commands with increased time Safety/Judgement: Decreased awareness of safety;Decreased awareness of deficits Awareness: Intellectual Problem Solving: Difficulty sequencing;Requires verbal cues;Requires tactile cues General Comments: Unaware of hip fracture    Exercises Total Joint Exercises Ankle Circles/Pumps: AROM;Both;10 reps;Seated Quad Sets: Strengthening;Both;10 reps;Seated Heel Slides: AAROM;Strengthening;Both;10 reps;Seated Hip ABduction/ADduction: AAROM;Strengthening;Both;10 reps;Seated Long Arc Quad: Strengthening;Both;10 reps;Seated    General Comments General comments (skin integrity, edema, etc.): Husband present very supportive. Pt urinated in [facility].      Pertinent Vitals/Pain Pain Assessment: Faces Faces Pain Scale: Hurts a little bit Pain Location: Rt hip Pain Descriptors / Indicators: Guarding Pain Intervention(s): Monitored  during session;Repositioned;Premedicated before session    Home Living  Prior Function            PT Goals (current goals can now be found in the care plan section) Acute Rehab PT Goals Patient Stated Goal: I don't know why this happend, I've always been active. PT Goal Formulation: Patient unable to participate in goal setting Time For Goal Achievement: 08/18/15 Potential to Achieve Goals: Good Progress towards PT goals: Progressing toward goals    Frequency  Min 3X/week    PT Plan Current plan remains appropriate    Co-evaluation             End of Session Equipment Utilized During Treatment: Gait belt Activity Tolerance: Patient tolerated treatment well Patient left: in chair;with call bell/phone within reach;with chair alarm set;with family/visitor present;with SCD's reapplied     Time: 0902-0923 PT Time Calculation (min) (ACUTE ONLY): 21 min  Charges:  $Therapeutic Activity: 8-22 mins                    G Codes:      Connie Holder 08/27/15, 9:49 AM Charlsie Merles, PT [phone]

## 2017-02-03 ENCOUNTER — Encounter (HOSPITAL_BASED_OUTPATIENT_CLINIC_OR_DEPARTMENT_OTHER): Payer: Self-pay | Admitting: Emergency Medicine

## 2017-02-03 ENCOUNTER — Emergency Department (HOSPITAL_BASED_OUTPATIENT_CLINIC_OR_DEPARTMENT_OTHER): Payer: Medicare Other

## 2017-02-03 ENCOUNTER — Emergency Department (HOSPITAL_BASED_OUTPATIENT_CLINIC_OR_DEPARTMENT_OTHER)
Admission: EM | Admit: 2017-02-03 | Discharge: 2017-02-03 | Disposition: A | Discharge status: Home / Self Care | Payer: Medicare Other | Attending: Emergency Medicine | Admitting: Emergency Medicine

## 2017-02-03 DIAGNOSIS — Z79899 Other long term (current) drug therapy: Secondary | ICD-10-CM | POA: Insufficient documentation

## 2017-02-03 DIAGNOSIS — R059 Cough, unspecified: Secondary | ICD-10-CM

## 2017-02-03 DIAGNOSIS — R05 Cough: Secondary | ICD-10-CM | POA: Diagnosis not present

## 2017-02-03 MED ORDER — DOXYCYCLINE HYCLATE 100 MG PO CAPS: 100.0000 mg | ORAL_CAPSULE | Freq: Two times a day (BID) | ORAL | 0 refills | Status: DC

## 2017-02-03 MED ORDER — GUAIFENESIN ER 1200 MG PO TB12: 1.0000 | ORAL_TABLET | Freq: Two times a day (BID) | ORAL | 0 refills | Status: AC

## 2017-02-03 MED ORDER — BENZONATATE 100 MG PO CAPS: 100.0000 mg | ORAL_CAPSULE | Freq: Three times a day (TID) | ORAL | 0 refills | Status: AC

## 2017-02-03 MED ORDER — GUAIFENESIN ER 1200 MG PO TB12: 1.0000 | ORAL_TABLET | Freq: Two times a day (BID) | ORAL | 0 refills | Status: DC

## 2017-02-03 MED ORDER — BENZONATATE 100 MG PO CAPS: 100.0000 mg | ORAL_CAPSULE | Freq: Three times a day (TID) | ORAL | 0 refills | Status: DC

## 2017-02-03 MED ORDER — PROMETHAZINE-DM 6.25-15 MG/5ML PO SYRP: ORAL_SOLUTION | ORAL | 0 refills | Status: DC

## 2017-02-03 NOTE — Discharge Instructions (Signed)
Return here as needed.  There is no signs of pneumonia on your chest x-ray.  Follow-up with your primary doctor

## 2017-02-03 NOTE — ED Triage Notes (Signed)
Pt having a cough for 2-3 days at night.  Husband is worried that she may have picked up his URI

## 2017-02-05 NOTE — ED Provider Notes (Signed)
[facility] Provider [facility]   CSN: [id number] Arrival date & time: 02/03/17  1028     History   Chief Complaint Chief Complaint  Patient presents with  . Cough    HPI Connie Holder is a 83 y.o. female.  HPI Patient presents to the emergency department with cough over the last 3 days.  The history is provided by the daughter and husband because the patient has dementia.  She has not had any vomiting, fevers or other complaints.  They did not give her any medications prior to arrival. The patient denies chest pain, shortness of breath, headache,blurred vision, neck pain, fever, weakness, numbness, dizziness, anorexia, edema, abdominal pain, nausea, vomiting, diarrhea, rash, back pain, dysuria, hematemesis, bloody stool, near syncope, or syncope. Past Medical History:  Diagnosis Date  . Dementia   . RA (rheumatoid arthritis) [facility])     Patient Active Problem List   Diagnosis Date Noted  . Normocytic anemia 08/12/2015  . Closed right hip fracture (HCC) 08/09/2015  . Fall at home 08/09/2015  . RA (rheumatoid arthritis) (HCC) 08/09/2015  . Dementia 08/09/2015    Past Surgical History:  Procedure Laterality Date  . ABDOMINAL HYSTERECTOMY    . TOTAL HIP ARTHROPLASTY Right 08/10/2015   Procedure: RIGHT HEMI-HIP ARTHROPLASTY ANTERIOR APPROACH;  Surgeon: Tarry Kos, MD;  Location: [facility] OR;  Service: Orthopedics;  Laterality: Right;    OB History    No data available       Home Medications    Prior to Admission medications   Medication Sig Start Date End Date Taking? Authorizing Provider  acetaminophen (TYLENOL) 325 MG tablet Take 2 tablets (650 mg total) by mouth every 6 (six) hours as needed for mild pain (or Fever >/= 101). 08/12/15   Osvaldo Shipper, MD  benzonatate (TESSALON) 100 MG capsule Take 1 capsule (100 mg total) by mouth every 8 (eight) hours. 02/03/17   Dyke Weible, Cristal Deer, PA-C  Guaifenesin 1200 MG TB12 Take 1 tablet (1,200 mg total) by mouth 2 (two)  times daily. 02/03/17   Joeleen Wortley, Cristal Deer, PA-C  haloperidol (HALDOL) 0.5 MG tablet Take 1 tablet (0.5 mg total) by mouth every 8 (eight) hours as needed for agitation. 08/13/15   Osvaldo Shipper, MD  methocarbamol (ROBAXIN) 500 MG tablet Take 1 tablet (500 mg total) by mouth every 6 (six) hours as needed for muscle spasms. 08/12/15   Osvaldo Shipper, MD  oxyCODONE (OXY IR/ROXICODONE) 5 MG immediate release tablet Take 1-3 tablets (5-15 mg total) by mouth every 4 (four) hours as needed. 08/10/15   Tarry Kos, MD  QUEtiapine (SEROQUEL) 25 MG tablet Take 0.5 tablets (12.5 mg total) by mouth at bedtime. 08/12/15   Osvaldo Shipper, MD    Family History No family history on file.  Social History Social History  Substance Use Topics  . Smoking status: Never Smoker  . Smokeless tobacco: Not on file  . Alcohol use No     Allergies   Aspirin; Other; and Peanut oil   Review of Systems Review of Systems  Level 5 caveat applies due to dementia Physical Exam Updated Vital Signs BP 123/77   Pulse 86   Temp 97.8 F (36.6 C)   Resp 16   Physical Exam  Constitutional: She appears well-developed and well-nourished. No distress.  HENT:  Head: Normocephalic and atraumatic.  Mouth/Throat: Oropharynx is clear and moist.  Eyes: Pupils are equal, round, and reactive to light.  Neck: Normal range of motion. Neck supple.  Cardiovascular: Normal rate,  regular rhythm and normal heart sounds.  Exam reveals no gallop and no friction rub.   No murmur heard. Pulmonary/Chest: Effort normal and breath sounds normal. No respiratory distress. She has no wheezes.  Neurological: She is alert. She exhibits normal muscle tone. Coordination normal.  Skin: Skin is warm and dry. Capillary refill takes less than 2 seconds. No rash noted. No erythema.  Psychiatric: She has a normal mood and affect. Her behavior is normal.  Nursing note and vitals reviewed.    ED Treatments / Results  Labs (all labs  ordered are listed, but only abnormal results are displayed) Labs Reviewed - No data to display  EKG  EKG Interpretation None       Radiology No results found.  Procedures Procedures (including critical care time)  Medications Ordered in ED Medications - No data to display   Initial Impression / Assessment and Plan / ED Course  I have reviewed the triage vital signs and the nursing notes.  Pertinent labs & imaging results that were available during my care of the patient were reviewed by me and considered in my medical decision making (see chart for details).     Patient be treated for upper respiratory infection.  Told to follow up with her primary care doctor, told to increase her fluid intake, have her rest as much as possible.  Return here for any worsening in her condition  Final Clinical Impressions(s) / ED Diagnoses   Final diagnoses:  Cough    New Prescriptions Discharge Medication List as of 02/03/2017 12:12 PM    START taking these medications   Details  benzonatate (TESSALON) 100 MG capsule Take 1 capsule (100 mg total) by mouth every 8 (eight) hours., Starting Wed 02/03/2017, Print    Guaifenesin 1200 MG TB12 Take 1 tablet (1,200 mg total) by mouth 2 (two) times daily., Starting Wed 02/03/2017, Print         Sewell Pitner, [place], PA-C 02/05/17 1648    Vanetta Mulders, MD 02/18/17 [phone]

## 2018-03-15 ENCOUNTER — Encounter (HOSPITAL_COMMUNITY): Payer: Self-pay | Admitting: Emergency Medicine

## 2018-03-15 ENCOUNTER — Other Ambulatory Visit: Payer: Self-pay

## 2018-03-15 ENCOUNTER — Emergency Department (HOSPITAL_COMMUNITY)
Admission: EM | Admit: 2018-03-15 | Discharge: 2018-03-15 | Disposition: A | Discharge status: Home / Self Care | Payer: Medicare Other | Attending: Emergency Medicine | Admitting: Emergency Medicine

## 2018-03-15 ENCOUNTER — Emergency Department (HOSPITAL_COMMUNITY): Payer: Medicare Other

## 2018-03-15 DIAGNOSIS — Y999 Unspecified external cause status: Secondary | ICD-10-CM | POA: Insufficient documentation

## 2018-03-15 DIAGNOSIS — Y92129 Unspecified place in nursing home as the place of occurrence of the external cause: Secondary | ICD-10-CM | POA: Diagnosis not present

## 2018-03-15 DIAGNOSIS — Z91010 Allergy to peanuts: Secondary | ICD-10-CM | POA: Insufficient documentation

## 2018-03-15 DIAGNOSIS — Z96641 Presence of right artificial hip joint: Secondary | ICD-10-CM | POA: Insufficient documentation

## 2018-03-15 DIAGNOSIS — F0390 Unspecified dementia without behavioral disturbance: Secondary | ICD-10-CM | POA: Diagnosis not present

## 2018-03-15 DIAGNOSIS — M79604 Pain in right leg: Secondary | ICD-10-CM | POA: Diagnosis present

## 2018-03-15 DIAGNOSIS — Z79899 Other long term (current) drug therapy: Secondary | ICD-10-CM | POA: Diagnosis not present

## 2018-03-15 DIAGNOSIS — W1830XA Fall on same level, unspecified, initial encounter: Secondary | ICD-10-CM | POA: Insufficient documentation

## 2018-03-15 DIAGNOSIS — Y939 Activity, unspecified: Secondary | ICD-10-CM | POA: Insufficient documentation

## 2018-03-15 NOTE — ED Notes (Signed)
Pt and family have decided to transport pt to [facility] facility in their POV instead of waiting on [facility] for transport due to possible long wait time. This RN called [facility] and spoke with Domenica Fail, Med Tech, about pt care from ED and to inform the facility that the pt will be returning to the facility with sister in law

## 2018-03-15 NOTE — ED Provider Notes (Signed)
[facility] EMERGENCY DEPARTMENT Provider Note   CSN: [id number] Arrival date & time: 03/15/18  1550     History   Chief Complaint Chief Complaint  Patient presents with  . Fall    HPI Connie Holder is a 84 y.o. female.  Level 5 caveat dementia.  Patient is a 84 year old female that was found on the ground at her nursing home.  Thought to maybe have fallen.  When they picked her up they thought that she had some pain to the right leg.  EMS with may be some pain about the right mid thigh.  Patient is unable to provide any further history.  The history is provided by the EMS personnel.  Fall  This is a new problem. The current episode started 3 to 5 hours ago. The problem occurs constantly. The problem has not changed since onset.Pertinent negatives include no chest pain, no headaches and no shortness of breath. Nothing aggravates the symptoms. Nothing relieves the symptoms. She has tried nothing for the symptoms. The treatment provided no relief.    Past Medical History:  Diagnosis Date  . Dementia   . RA (rheumatoid arthritis) [facility])     Patient Active Problem List   Diagnosis Date Noted  . Normocytic anemia 08/12/2015  . Closed right hip fracture (HCC) 08/09/2015  . Fall at home 08/09/2015  . RA (rheumatoid arthritis) (HCC) 08/09/2015  . Dementia 08/09/2015    Past Surgical History:  Procedure Laterality Date  . ABDOMINAL HYSTERECTOMY    . TOTAL HIP ARTHROPLASTY Right 08/10/2015   Procedure: RIGHT HEMI-HIP ARTHROPLASTY ANTERIOR APPROACH;  Surgeon: Tarry Kos, MD;  Location: [facility] OR;  Service: Orthopedics;  Laterality: Right;     OB History   None      Home Medications    Prior to Admission medications   Medication Sig Start Date End Date Taking? Authorizing Provider  acetaminophen (TYLENOL) 325 MG tablet Take 2 tablets (650 mg total) by mouth every 6 (six) hours as needed for mild pain (or Fever >/= 101). 08/12/15   Osvaldo Shipper, MD    benzonatate (TESSALON) 100 MG capsule Take 1 capsule (100 mg total) by mouth every 8 (eight) hours. 02/03/17   Lawyer, Cristal Deer, PA-C  Guaifenesin 1200 MG TB12 Take 1 tablet (1,200 mg total) by mouth 2 (two) times daily. 02/03/17   Lawyer, Cristal Deer, PA-C  haloperidol (HALDOL) 0.5 MG tablet Take 1 tablet (0.5 mg total) by mouth every 8 (eight) hours as needed for agitation. 08/13/15   Osvaldo Shipper, MD  methocarbamol (ROBAXIN) 500 MG tablet Take 1 tablet (500 mg total) by mouth every 6 (six) hours as needed for muscle spasms. 08/12/15   Osvaldo Shipper, MD  oxyCODONE (OXY IR/ROXICODONE) 5 MG immediate release tablet Take 1-3 tablets (5-15 mg total) by mouth every 4 (four) hours as needed. 08/10/15   Tarry Kos, MD  QUEtiapine (SEROQUEL) 25 MG tablet Take 0.5 tablets (12.5 mg total) by mouth at bedtime. 08/12/15   Osvaldo Shipper, MD    Family History No family history on file.  Social History Social History   Tobacco Use  . Smoking status: Never Smoker  Substance Use Topics  . Alcohol use: No  . Drug use: Not on file     Allergies   Aspirin; Other; and Peanut oil   Review of Systems Review of Systems  Constitutional: Negative for chills and fever.  HENT: Negative for congestion and rhinorrhea.   Eyes: Negative for redness and visual disturbance.  Respiratory: Negative for shortness of breath and wheezing.   Cardiovascular: Negative for chest pain and palpitations.  Gastrointestinal: Negative for nausea and vomiting.  Genitourinary: Negative for dysuria and urgency.  Musculoskeletal: Positive for arthralgias. Negative for myalgias.  Skin: Negative for pallor and wound.  Neurological: Negative for dizziness and headaches.     Physical Exam Updated Vital Signs BP 137/72   Pulse 81   Temp 98.5 F (36.9 C) (Oral)   Resp 18   Ht 5\' 2"  (1.575 m)   Wt 52.2 kg (115 lb)   SpO2 97%   BMI 21.03 kg/m   Physical Exam  Constitutional: She appears well-developed and  well-nourished. No distress.  HENT:  Head: Normocephalic and atraumatic.  Eyes: Pupils are equal, round, and reactive to light. EOM are normal.  Neck: Normal range of motion. Neck supple.  Cardiovascular: Normal rate and regular rhythm. Exam reveals no gallop and no friction rub.  No murmur heard. Pulmonary/Chest: Effort normal. She has no wheezes. She has no rales.  Abdominal: Soft. She exhibits no distension. There is no tenderness.  Musculoskeletal: She exhibits no edema or tenderness.  Palpated from head to toe without any noted bony tenderness.  Internal and external rotation of the right hip without pain.  No noted pain or deformity to the right thigh.  Neurological: She is alert.  Skin: Skin is warm and dry. She is not diaphoretic.  Nursing note and vitals reviewed.    ED Treatments / Results  Labs (all labs ordered are listed, but only abnormal results are displayed) Labs Reviewed - No data to display  EKG None  Radiology Dg Pelvis Portable  Result Date: 03/15/2018 CLINICAL DATA:  Fall with lateral right leg pain. EXAM: PORTABLE PELVIS 1-2 VIEWS COMPARISON:  11/18/2015 FINDINGS: Stable appearance of the right hip arthroplasty. Right hip appears to be located on these frontal views. Pelvic bony ring is intact. No gross abnormality to the left hip joint. Degenerative changes in lower lumbar spine. IMPRESSION: No acute abnormality to the pelvis or right hip arthroplasty. Electronically Signed   By: Richarda Overlie M.D.   On: 03/15/2018 18:27   Dg Femur Portable Min 2 Views Right  Result Date: 03/15/2018 CLINICAL DATA:  Fall. EXAM: RIGHT FEMUR PORTABLE 2 VIEW COMPARISON:  Radiographs of August 09, 2015. FINDINGS: Status post right hip arthroplasty. Findings consistent with old healed proximal right femoral fracture is noted. No acute fracture or dislocation is noted. IMPRESSION: Old postsurgical and posttraumatic changes as described above. No definite acute abnormality seen in the  right femur. Electronically Signed   By: Lupita Raider, M.D.   On: 03/15/2018 18:30    Procedures Procedures (including critical care time)  Medications Ordered in ED Medications - No data to display   Initial Impression / Assessment and Plan / ED Course  I have reviewed the triage vital signs and the nursing notes.  Pertinent labs & imaging results that were available during my care of the patient were reviewed by me and considered in my medical decision making (see chart for details).     84 yo F found on the ground.  Thought to may be had a fall.  There is some concern that she was having right-sided hip pain and so was sent to the ED for evaluation.  Patient is very demented.  No pain was reproduced on my exam.  With reported history of pain will obtain a plain film.  Plain film of the right hip and femur  negative for acute fracture is viewed by me.  No dislocation.  Discussed with the family.  Will discharge back home.  6:59 PM:  I have discussed the diagnosis/risks/treatment options with the patient and family and believe the pt to be eligible for discharge home to follow-up with PCP. We also discussed returning to the ED immediately if new or worsening sx occur. We discussed the sx which are most concerning (e.g., sudden worsening pain, fever, inability to tolerate by mouth) that necessitate immediate return. Medications administered to the patient during their visit and any new prescriptions provided to the patient are listed below.  Medications given during this visit Medications - No data to display    The patient appears reasonably screen and/or stabilized for discharge and I doubt any other medical condition or other [facility] requiring further screening, evaluation, or treatment in the ED at this time prior to discharge.    Final Clinical Impressions(s) / ED Diagnoses   Final diagnoses:  Right leg pain    ED Discharge Orders    None       Melene Plan, DO 03/15/18  1859

## 2018-03-15 NOTE — ED Triage Notes (Signed)
Pt arrives to ED from [facility] facility with complaints of unwitnessed fall and pain to right lateral leg. EMS reports facility states unwitnessed fall today. Facility states pt was found in her room sitting on her bottom in front of the couch. Pt only complains of pain to lateral side right leg. Pt has hx of demenia. Is Alert at baseline per facility. Pt placed in position of comfort with bed locked and lowered.

## 2018-03-15 NOTE — ED Notes (Signed)
Portable XR at bedside

## 2018-03-15 NOTE — ED Notes (Signed)
Patient verbalizes understanding of discharge instructions. Opportunity for questioning and answers were provided. Armband removed by staff, pt discharged from ED.  

## 2018-03-15 NOTE — ED Notes (Signed)
Pt family has DNR papers with them and will bring them to facility

## 2018-03-18 ENCOUNTER — Emergency Department (HOSPITAL_COMMUNITY)
Admission: EM | Admit: 2018-03-18 | Discharge: 2018-03-18 | Disposition: A | Discharge status: Home / Self Care | Payer: Medicare Other | Attending: Emergency Medicine | Admitting: Emergency Medicine

## 2018-03-18 ENCOUNTER — Emergency Department (HOSPITAL_COMMUNITY): Payer: Medicare Other

## 2018-03-18 ENCOUNTER — Encounter (HOSPITAL_COMMUNITY): Payer: Self-pay | Admitting: Emergency Medicine

## 2018-03-18 DIAGNOSIS — M25561 Pain in right knee: Secondary | ICD-10-CM | POA: Diagnosis not present

## 2018-03-18 DIAGNOSIS — Y999 Unspecified external cause status: Secondary | ICD-10-CM | POA: Insufficient documentation

## 2018-03-18 DIAGNOSIS — Y929 Unspecified place or not applicable: Secondary | ICD-10-CM | POA: Diagnosis not present

## 2018-03-18 DIAGNOSIS — M542 Cervicalgia: Secondary | ICD-10-CM | POA: Insufficient documentation

## 2018-03-18 DIAGNOSIS — R51 Headache: Secondary | ICD-10-CM | POA: Diagnosis present

## 2018-03-18 DIAGNOSIS — Z91010 Allergy to peanuts: Secondary | ICD-10-CM | POA: Insufficient documentation

## 2018-03-18 DIAGNOSIS — Z96641 Presence of right artificial hip joint: Secondary | ICD-10-CM | POA: Insufficient documentation

## 2018-03-18 DIAGNOSIS — W0110XA Fall on same level from slipping, tripping and stumbling with subsequent striking against unspecified object, initial encounter: Secondary | ICD-10-CM | POA: Insufficient documentation

## 2018-03-18 DIAGNOSIS — W19XXXA Unspecified fall, initial encounter: Secondary | ICD-10-CM

## 2018-03-18 DIAGNOSIS — Y9301 Activity, walking, marching and hiking: Secondary | ICD-10-CM | POA: Insufficient documentation

## 2018-03-18 DIAGNOSIS — M79671 Pain in right foot: Secondary | ICD-10-CM | POA: Insufficient documentation

## 2018-03-18 DIAGNOSIS — F0390 Unspecified dementia without behavioral disturbance: Secondary | ICD-10-CM | POA: Diagnosis not present

## 2018-03-18 DIAGNOSIS — Z79899 Other long term (current) drug therapy: Secondary | ICD-10-CM | POA: Insufficient documentation

## 2018-03-18 DIAGNOSIS — M79604 Pain in right leg: Secondary | ICD-10-CM

## 2018-03-18 NOTE — ED Provider Notes (Signed)
[facility] EMERGENCY DEPARTMENT Provider Note   CSN: [id number] Arrival date & time: 03/18/18  0913     History   Chief Complaint Chief Complaint  Patient presents with  . Fall    HPI Connie Holder is a 84 y.o. female.  She is brought to the emergency department by ambulance from her facility at [facility] memory care after a fall.  Patient reportedly was walking away from the dining room and fell on her right side.  She was assisted back to standing but seemed to be favoring that right leg.  Level 5 caveat secondary to dementia, patient unable to provide any history.  Interestingly the patient was here 3 days ago for a presumed fall when she was found on the ground and had x-rays of her pelvis and right femur for possible pain.  The history is provided by the patient and the nursing home.  Fall  This is a recurrent problem. The current episode started 1 to 2 hours ago. The problem has not changed since onset.She has tried nothing for the symptoms.    Past Medical History:  Diagnosis Date  . Dementia   . RA (rheumatoid arthritis) [facility])     Patient Active Problem List   Diagnosis Date Noted  . Normocytic anemia 08/12/2015  . Closed right hip fracture (HCC) 08/09/2015  . Fall at home 08/09/2015  . RA (rheumatoid arthritis) (HCC) 08/09/2015  . Dementia 08/09/2015    Past Surgical History:  Procedure Laterality Date  . ABDOMINAL HYSTERECTOMY    . TOTAL HIP ARTHROPLASTY Right 08/10/2015   Procedure: RIGHT HEMI-HIP ARTHROPLASTY ANTERIOR APPROACH;  Surgeon: Tarry Kos, MD;  Location: [facility] OR;  Service: Orthopedics;  Laterality: Right;     OB History   None      Home Medications    Prior to Admission medications   Medication Sig Start Date End Date Taking? Authorizing Provider  acetaminophen (TYLENOL) 325 MG tablet Take 2 tablets (650 mg total) by mouth every 6 (six) hours as needed for mild pain (or Fever >/= 101). 08/12/15   Osvaldo Shipper, MD    benzonatate (TESSALON) 100 MG capsule Take 1 capsule (100 mg total) by mouth every 8 (eight) hours. 02/03/17   Lawyer, Cristal Deer, PA-C  Guaifenesin 1200 MG TB12 Take 1 tablet (1,200 mg total) by mouth 2 (two) times daily. 02/03/17   Lawyer, Cristal Deer, PA-C  haloperidol (HALDOL) 0.5 MG tablet Take 1 tablet (0.5 mg total) by mouth every 8 (eight) hours as needed for agitation. 08/13/15   Osvaldo Shipper, MD  methocarbamol (ROBAXIN) 500 MG tablet Take 1 tablet (500 mg total) by mouth every 6 (six) hours as needed for muscle spasms. 08/12/15   Osvaldo Shipper, MD  oxyCODONE (OXY IR/ROXICODONE) 5 MG immediate release tablet Take 1-3 tablets (5-15 mg total) by mouth every 4 (four) hours as needed. 08/10/15   Tarry Kos, MD  QUEtiapine (SEROQUEL) 25 MG tablet Take 0.5 tablets (12.5 mg total) by mouth at bedtime. 08/12/15   Osvaldo Shipper, MD    Family History No family history on file.  Social History Social History   Tobacco Use  . Smoking status: Never Smoker  Substance Use Topics  . Alcohol use: No  . Drug use: Not on file     Allergies   Aspirin; Other; and Peanut oil   Review of Systems Review of Systems  Unable to perform ROS: Dementia     Physical Exam Updated Vital Signs BP 125/62  Pulse 63   Resp 16   Wt 52.2 kg (115 lb)   SpO2 98%   BMI 21.03 kg/m   Physical Exam  Constitutional: She appears well-developed and well-nourished. No distress.  HENT:  Head: Normocephalic and atraumatic.  Right Ear: External ear normal.  Left Ear: External ear normal.  Nose: Nose normal.  Eyes: Conjunctivae are normal. Right eye exhibits no discharge. Left eye exhibits no discharge.  Neck: Neck supple. No tracheal deviation present.  Cardiovascular: Normal rate, regular rhythm, normal heart sounds and intact distal pulses.  Pulmonary/Chest: Effort normal. No respiratory distress. She has no wheezes. She has no rales.  Abdominal: Soft. There is no tenderness. There is no  guarding.  Musculoskeletal:  She has a superficial abrasion on her left shoulder.  Full range of motion of her upper extremities with no obvious limitations and no deformities.  She intermittently will become agitated when range of motion being her lower extremities.  She seems to have normal internal/external rotation and no limitations on range of motion of her hips knees or ankles.  Neurological: She is alert. GCS eye subscore is 4. GCS verbal subscore is 5. GCS motor subscore is 6.  Patient is awake and alert but not communicative.  She intermittently yells out in gibberish.  She seems to be moving all 4 extremities without any limitations  Skin: Skin is warm and dry. Capillary refill takes less than 2 seconds.  Psychiatric: She has a normal mood and affect.     ED Treatments / Results  Labs (all labs ordered are listed, but only abnormal results are displayed) Labs Reviewed - No data to display  EKG None  Radiology Ct Head Wo Contrast  Result Date: 03/18/2018 CLINICAL DATA:  Pain following fall.  Underlying dementia EXAM: CT HEAD WITHOUT CONTRAST CT CERVICAL SPINE WITHOUT CONTRAST TECHNIQUE: Multidetector CT imaging of the head and cervical spine was performed following the standard protocol without intravenous contrast. Multiplanar CT image reconstructions of the cervical spine were also generated. COMPARISON:  Head CT April 22, 2017 FINDINGS: CT HEAD FINDINGS Brain: Moderate diffuse atrophy is stable. There is no intracranial mass, hemorrhage, extra-axial fluid collection, or midline shift. There is patchy small vessel disease in the centra semiovale bilaterally, stable. No new gray-white compartment lesion evident. No acute infarct appreciable. Vascular: No evident hyperdense vessel. There is calcification in each carotid siphon region. Skull: Bony calvarium appears intact. Sinuses/Orbits: There is mucosal thickening in multiple ethmoid air cells. Paranasal sinuses elsewhere clear.  There is rightward deviation of the nasal septum. Orbits appear symmetric bilaterally. Other: There is diffuse opacification of mastoid air cells on the left. Mastoids on the right are clear except for opacification of a few posterior right ethmoid air cells. CT CERVICAL SPINE FINDINGS Alignment: There is 1 mm of anterolisthesis of C6 on C7. There is 1 mm of anterolisthesis of C7 on T1. No other spondylolisthesis. Skull base and vertebrae: The skull base and craniocervical junction regions appear normal. There is calcification superior to the odontoid which is associated with pannus. There is no significant impression on the craniocervical junction in this area. Note that there is erosion within the odontoid. There is no appreciable fracture. There are no blastic or lytic bone lesions. Soft tissues and spinal canal: Prevertebral soft tissues and predental space regions are normal. There is no paraspinous lesion. No evident cord or canal hematoma. Disc levels: There is severe disc space narrowing at C3-4, C4-5, and C5-6. There is multilevel facet arthropathy. There  is no frank disc extrusion or high-grade stenosis. Upper chest: Visualized upper lung regions are clear. There is aortic atherosclerosis. Other: There is calcification in each carotid artery. IMPRESSION: CT head: Stable diffuse atrophy with patchy periventricular small vessel disease. No mass or hemorrhage. No acute infarct or extra-axial fluid collection There are foci of arterial vascular calcification. There is bilateral ethmoid sinus disease. There is mastoid disease bilaterally, more severe on the left than on the right. CT cervical spine: 1. No acute fracture. Areas of slight spondylolisthesis at C6-7 and C7-T1 felt to be due to underlying spondylosis. 2. Erosive changes in the odontoid. Bony overgrowth superior to the odontoid, not causing significant impression on the craniocervical junction. 3. Multilevel arthropathy. No frank disc extrusion or  high-grade stenosis. 4. Aortic atherosclerosis. Foci of carotid artery calcification bilaterally. Aortic Atherosclerosis (ICD10-I70.0). Electronically Signed   By: Bretta Bang III M.D.   On: 03/18/2018 10:37   Ct Cervical Spine Wo Contrast  Result Date: 03/18/2018 CLINICAL DATA:  Pain following fall.  Underlying dementia EXAM: CT HEAD WITHOUT CONTRAST CT CERVICAL SPINE WITHOUT CONTRAST TECHNIQUE: Multidetector CT imaging of the head and cervical spine was performed following the standard protocol without intravenous contrast. Multiplanar CT image reconstructions of the cervical spine were also generated. COMPARISON:  Head CT April 22, 2017 FINDINGS: CT HEAD FINDINGS Brain: Moderate diffuse atrophy is stable. There is no intracranial mass, hemorrhage, extra-axial fluid collection, or midline shift. There is patchy small vessel disease in the centra semiovale bilaterally, stable. No new gray-white compartment lesion evident. No acute infarct appreciable. Vascular: No evident hyperdense vessel. There is calcification in each carotid siphon region. Skull: Bony calvarium appears intact. Sinuses/Orbits: There is mucosal thickening in multiple ethmoid air cells. Paranasal sinuses elsewhere clear. There is rightward deviation of the nasal septum. Orbits appear symmetric bilaterally. Other: There is diffuse opacification of mastoid air cells on the left. Mastoids on the right are clear except for opacification of a few posterior right ethmoid air cells. CT CERVICAL SPINE FINDINGS Alignment: There is 1 mm of anterolisthesis of C6 on C7. There is 1 mm of anterolisthesis of C7 on T1. No other spondylolisthesis. Skull base and vertebrae: The skull base and craniocervical junction regions appear normal. There is calcification superior to the odontoid which is associated with pannus. There is no significant impression on the craniocervical junction in this area. Note that there is erosion within the odontoid. There is  no appreciable fracture. There are no blastic or lytic bone lesions. Soft tissues and spinal canal: Prevertebral soft tissues and predental space regions are normal. There is no paraspinous lesion. No evident cord or canal hematoma. Disc levels: There is severe disc space narrowing at C3-4, C4-5, and C5-6. There is multilevel facet arthropathy. There is no frank disc extrusion or high-grade stenosis. Upper chest: Visualized upper lung regions are clear. There is aortic atherosclerosis. Other: There is calcification in each carotid artery. IMPRESSION: CT head: Stable diffuse atrophy with patchy periventricular small vessel disease. No mass or hemorrhage. No acute infarct or extra-axial fluid collection There are foci of arterial vascular calcification. There is bilateral ethmoid sinus disease. There is mastoid disease bilaterally, more severe on the left than on the right. CT cervical spine: 1. No acute fracture. Areas of slight spondylolisthesis at C6-7 and C7-T1 felt to be due to underlying spondylosis. 2. Erosive changes in the odontoid. Bony overgrowth superior to the odontoid, not causing significant impression on the craniocervical junction. 3. Multilevel arthropathy. No frank disc extrusion  or high-grade stenosis. 4. Aortic atherosclerosis. Foci of carotid artery calcification bilaterally. Aortic Atherosclerosis (ICD10-I70.0). Electronically Signed   By: Bretta Bang III M.D.   On: 03/18/2018 10:37   Dg Knee Complete 4 Views Right  Result Date: 03/18/2018 CLINICAL DATA:  Right knee pain due to a fall today. Initial encounter. EXAM: RIGHT KNEE - COMPLETE 4+ VIEW COMPARISON:  None. FINDINGS: No acute bony or joint abnormality is identified. Mild to moderate osteoarthritis about the knee is most notable in the medial compartment. No joint effusion. No focal bony lesion. IMPRESSION: No acute abnormality. Mild to moderate osteoarthritis. Electronically Signed   By: Drusilla Kanner M.D.   On: 03/18/2018  10:17   Dg Foot Complete Right  Result Date: 03/18/2018 CLINICAL DATA:  Fall.  Hip, knee and right foot pain. EXAM: RIGHT FOOT COMPLETE - 3+ VIEW COMPARISON:  None. FINDINGS: No acute fracture. Old second metatarsal fracture is well-healed. There is a circumferential wire across the shaft. There is asymmetric joint space narrowing with small marginal osteophytes at the first and second metatarsophalangeal joints. There is remodeling of the second metatarsal head. Mild joint space narrowing with small marginal osteophytes is noted at the IP joint of the great toe. Findings are consistent with osteoarthritis. Small plantar calcaneal spur. Bones are demineralized. Soft tissues are unremarkable. IMPRESSION: No acute fracture or dislocation. Electronically Signed   By: Amie Portland M.D.   On: 03/18/2018 10:20   Dg Hip Unilat With Pelvis 2-3 Views Right  Result Date: 03/18/2018 CLINICAL DATA:  Fall.  Right hip pain. EXAM: DG HIP (WITH OR WITHOUT PELVIS) 2-3V RIGHT COMPARISON:  Operative images dated 08/10/2015 FINDINGS: No convincing acute fracture. Right hip arthroplasty appears well seated and well-aligned, with no evidence of loosening. Bony pelvis is intact. Left hip joint, SI joints and symphysis pubis are normally aligned. Bones are diffusely demineralized. IMPRESSION: 1. No acute fracture or dislocation. 2. No evidence of loosening of the right total hip arthroplasty. Electronically Signed   By: Amie Portland M.D.   On: 03/18/2018 10:22    Procedures Procedures (including critical care time)  Medications Ordered in ED Medications - No data to display   Initial Impression / Assessment and Plan / ED Course  I have reviewed the triage vital signs and the nursing notes.  Pertinent labs & imaging results that were available during my care of the patient were reviewed by me and considered in my medical decision making (see chart for details).  Clinical Course as of Mar 18 1558  Fri Mar 18, 2018    8786 Patient's  family member here and able to provide a little more history.  She had fallen on Tuesday and has been ambulatory in between then and now but still seems to have some problems with her right leg.  If she asked if we could image the foot in case there is something there that is bothering her yet did not show up on the last exam.   [MB]  0955 At baseline it sounds like she walks independently.  She is newly been placed in this memory care unit as of Wednesday.   [MB]  1055 Patient's imaging has been unremarkable.  Her sister would like to drive her back to the facility.   [MB]    Clinical Course User Index [MB] Terrilee Files, MD     Final Clinical Impressions(s) / ED Diagnoses   Final diagnoses:  Fall, initial encounter  Leg pain, right    ED  Discharge Orders    None       Terrilee Files, MD 03/18/18 [phone]

## 2018-03-18 NOTE — ED Notes (Signed)
Patient transported to X-ray 

## 2018-03-18 NOTE — Discharge Instructions (Signed)
You are evaluated in the emergency department for a fall.  You had a CAT scan of your head, cervical spine, and x-rays of your pelvis right hip and right knee and right foot.  We did not find any signs of acute fracture.  Please have the patient be identified as a fall risk and watch for any further limitations.  Return as needed to the emergency department.

## 2018-03-18 NOTE — ED Triage Notes (Signed)
PT arrives via EMS from [facility] for a fall affecting right side. PT was seen for same 3 days ago. New fall this morning. PT stood up to walk away from dining room and fell on right side. EMS assisted her to stand and PT was more reluctant to use right leg. PT is unable to provide any information, yells nonsensical things. This is her baseline.   CBG 126, non diabetic

## 2018-04-23 ENCOUNTER — Encounter (HOSPITAL_COMMUNITY): Payer: Self-pay

## 2018-04-23 ENCOUNTER — Emergency Department (HOSPITAL_COMMUNITY)

## 2018-04-23 ENCOUNTER — Emergency Department (HOSPITAL_COMMUNITY)
Admission: EM | Admit: 2018-04-23 | Discharge: 2018-04-24 | Disposition: A | Discharge status: Home / Self Care | Attending: Emergency Medicine | Admitting: Emergency Medicine

## 2018-04-23 DIAGNOSIS — Z79899 Other long term (current) drug therapy: Secondary | ICD-10-CM | POA: Diagnosis not present

## 2018-04-23 DIAGNOSIS — Z96641 Presence of right artificial hip joint: Secondary | ICD-10-CM | POA: Insufficient documentation

## 2018-04-23 DIAGNOSIS — Y939 Activity, unspecified: Secondary | ICD-10-CM | POA: Diagnosis not present

## 2018-04-23 DIAGNOSIS — W1830XA Fall on same level, unspecified, initial encounter: Secondary | ICD-10-CM | POA: Diagnosis not present

## 2018-04-23 DIAGNOSIS — N189 Chronic kidney disease, unspecified: Secondary | ICD-10-CM | POA: Insufficient documentation

## 2018-04-23 DIAGNOSIS — R296 Repeated falls: Secondary | ICD-10-CM

## 2018-04-23 DIAGNOSIS — L089 Local infection of the skin and subcutaneous tissue, unspecified: Secondary | ICD-10-CM

## 2018-04-23 DIAGNOSIS — Y999 Unspecified external cause status: Secondary | ICD-10-CM | POA: Insufficient documentation

## 2018-04-23 DIAGNOSIS — S59912A Unspecified injury of left forearm, initial encounter: Secondary | ICD-10-CM | POA: Diagnosis present

## 2018-04-23 DIAGNOSIS — Y92129 Unspecified place in nursing home as the place of occurrence of the external cause: Secondary | ICD-10-CM | POA: Insufficient documentation

## 2018-04-23 DIAGNOSIS — F0390 Unspecified dementia without behavioral disturbance: Secondary | ICD-10-CM | POA: Diagnosis not present

## 2018-04-23 DIAGNOSIS — S51802A Unspecified open wound of left forearm, initial encounter: Secondary | ICD-10-CM | POA: Insufficient documentation

## 2018-04-23 DIAGNOSIS — Z91010 Allergy to peanuts: Secondary | ICD-10-CM | POA: Diagnosis not present

## 2018-04-23 HISTORY — DX: Chronic kidney disease, unspecified: N18.9

## 2018-04-23 HISTORY — DX: Cachexia: R64

## 2018-04-23 MED ORDER — CEPHALEXIN 500 MG PO CAPS: 500.0000 mg | ORAL_CAPSULE | Freq: Two times a day (BID) | ORAL | 0 refills | Status: AC

## 2018-04-23 MED ORDER — BACITRACIN ZINC 500 UNIT/GM EX OINT: 1.0000 "application " | TOPICAL_OINTMENT | Freq: Two times a day (BID) | CUTANEOUS | 0 refills | Status: AC

## 2018-04-23 MED ORDER — BACITRACIN ZINC 500 UNIT/GM EX OINT
1.0000 "application " | TOPICAL_OINTMENT | Freq: Two times a day (BID) | CUTANEOUS | Status: DC
  Administered 2018-04-23: 1 via TOPICAL
  Filled 2018-04-23: qty 1.8

## 2018-04-23 NOTE — ED Notes (Signed)
Patient transported to CT 

## 2018-04-23 NOTE — ED Notes (Signed)
Attempted to call [facility] with no answer  

## 2018-04-23 NOTE — ED Notes (Signed)
[facility] called for transport.  

## 2018-04-23 NOTE — Discharge Instructions (Signed)
The area on Connie Holder's left forearm looks infected. Keep wound clean and dry and apply bacitracin daily. Antibiotics are to cover for cellulitis in this area. Return if area looks worse, redness spreads, or she develops a fever.  No signs of head injury. Return to ER if any vomiting, lethargy, or other sudden changes.

## 2018-04-23 NOTE — ED Notes (Signed)
Attempted to call [facility] with no answer

## 2018-04-23 NOTE — ED Provider Notes (Signed)
[facility]-EMERGENCY DEPT Provider Note   CSN: [id number] Arrival date & time: 04/23/18  2005     History   Chief Complaint Chief Complaint  Patient presents with  . Fall    HPI Quanika Solem is a 84 y.o. female.  84 year old female with past medical history including dementia, rheumatoid arthritis, CKD who presents with fall.  This evening just prior to arrival, the patient was at her nursing facility and staff heard her fall.  They found her on the floor, fall was unwitnessed.  She is combative which is her baseline.  No anticoagulant use.  LEVEL 5 CAVEAT DUE TO DEMENTIA  The history is provided by the nursing home.  Fall     Past Medical History:  Diagnosis Date  . Cachexia (HCC)   . Chronic kidney disease   . Dementia   . RA (rheumatoid arthritis) [facility])     Patient Active Problem List   Diagnosis Date Noted  . Normocytic anemia 08/12/2015  . Closed right hip fracture (HCC) 08/09/2015  . Fall at home 08/09/2015  . RA (rheumatoid arthritis) (HCC) 08/09/2015  . Dementia 08/09/2015    Past Surgical History:  Procedure Laterality Date  . ABDOMINAL HYSTERECTOMY    . TOTAL HIP ARTHROPLASTY Right 08/10/2015   Procedure: RIGHT HEMI-HIP ARTHROPLASTY ANTERIOR APPROACH;  Surgeon: Tarry Kos, MD;  Location: [facility] OR;  Service: Orthopedics;  Laterality: Right;     OB History   None      Home Medications    Prior to Admission medications   Medication Sig Start Date End Date Taking? Authorizing Provider  bisacodyl (DULCOLAX) 10 MG suppository Place 10 mg rectally daily as needed for moderate constipation.   Yes [provider]  busPIRone (BUSPAR) 5 MG tablet Take 5 mg by mouth 3 (three) times daily.   Yes [provider]  citalopram (CELEXA) 20 MG tablet Take 20 mg by mouth daily.   Yes [provider]  donepezil (ARICEPT) 10 MG tablet Take 10 mg by mouth at bedtime.   Yes [provider]  levothyroxine  (SYNTHROID, LEVOTHROID) 100 MCG tablet Take 100 mcg by mouth daily before breakfast.   Yes [provider]  LORazepam (ATIVAN) 0.5 MG tablet Take 0.5 mg by mouth every 4 (four) hours as needed for anxiety.   Yes [provider]  Melatonin 3 MG TABS Take 3 mg by mouth at bedtime.   Yes [provider]  morphine (MSIR) 15 MG tablet Take 15 mg by mouth every 12 (twelve) hours.   Yes [provider]  pantoprazole (PROTONIX) 40 MG tablet Take 40 mg by mouth daily.   Yes [provider]  QUEtiapine (SEROQUEL) 100 MG tablet Take 100 mg by mouth 2 (two) times daily.   Yes [provider]  sennosides-docusate sodium (SENOKOT-S) 8.6-50 MG tablet Take 2 tablets by mouth daily.   Yes [provider]  acetaminophen (TYLENOL) 325 MG tablet Take 2 tablets (650 mg total) by mouth every 6 (six) hours as needed for mild pain (or Fever >/= 101). Patient not taking: Reported on 04/23/2018 08/12/15   Osvaldo Shipper, MD  benzonatate (TESSALON) 100 MG capsule Take 1 capsule (100 mg total) by mouth every 8 (eight) hours. Patient not taking: Reported on 04/23/2018 02/03/17   Charlestine Night, PA-C  Guaifenesin 1200 MG TB12 Take 1 tablet (1,200 mg total) by mouth 2 (two) times daily. Patient not taking: Reported on 04/23/2018 02/03/17   Charlestine Night, PA-C  haloperidol (HALDOL) 0.5 MG tablet Take 1 tablet (0.5 mg total) by mouth every 8 (eight) hours as needed for agitation. Patient not taking: Reported on 04/23/2018 08/13/15   Osvaldo Shipper, MD  methocarbamol (ROBAXIN) 500 MG tablet Take 1 tablet (500 mg total) by mouth every 6 (six) hours as needed for muscle spasms. Patient not taking: Reported on 04/23/2018 08/12/15   Osvaldo Shipper, MD  oxyCODONE (OXY IR/ROXICODONE) 5 MG immediate release tablet Take 1-3 tablets (5-15 mg total) by mouth every 4 (four) hours as needed. Patient not taking: Reported on 04/23/2018 08/10/15   Tarry Kos, MD  QUEtiapine  (SEROQUEL) 25 MG tablet Take 0.5 tablets (12.5 mg total) by mouth at bedtime. Patient not taking: Reported on 04/23/2018 08/12/15   Osvaldo Shipper, MD    Family History No family history on file.  Social History Social History   Tobacco Use  . Smoking status: Never Smoker  Substance Use Topics  . Alcohol use: No  . Drug use: Never     Allergies   Aspirin; Other; Peanut oil; Zolpidem; Donepezil; Lorazepam; and Quetiapine   Review of Systems Review of Systems  Unable to perform ROS: Dementia     Physical Exam Updated Vital Signs BP (!) 115/55 (BP Location: Left Arm)   Pulse 66   Temp 98.2 F (36.8 C) (Oral)   Resp 16   SpO2 97%   Physical Exam  Constitutional: She appears well-developed. No distress.  Cachectic, sleeping  HENT:  Head: Normocephalic and atraumatic.  Moist mucous membranes  Eyes: Right eye exhibits no discharge. Left eye exhibits no discharge.  Neck: Neck supple.  Cardiovascular: Normal rate, regular rhythm and normal heart sounds.  No murmur heard. Pulmonary/Chest: Effort normal and breath sounds normal. She exhibits no tenderness.  Abdominal: Soft. Bowel sounds are normal. She exhibits no distension. There is no tenderness.  Musculoskeletal: Normal range of motion. She exhibits no edema, tenderness or deformity.  Neurological:  Sleeping, disoriented and combative, swinging arms when stimulated  Skin: Skin is warm and dry.  Wound on L forearm with some discharge  Nursing note and vitals reviewed.    ED Treatments / Results  Labs (all labs ordered are listed, but only abnormal results are displayed) Labs Reviewed - No data to display  EKG None  Radiology No results found.  Procedures Procedures (including critical care time)  Medications Ordered in ED Medications  bacitracin ointment 1 application (has no administration in time range)     Initial Impression / Assessment and Plan / ED Course  I have reviewed the triage vital  signs and the nursing notes.  Pertinent imaging results that were available during my care of the patient were reviewed by me and considered in my medical decision making (see chart for details).     Patient with no external signs of trauma.  Head CT negative acute.  She did have an old wound on dorsal left forearm with some malodorous drainage, no extensive cellulitis but I do feel she should be treated for superficial skin infection.  Provided with Keflex and Bacitracin, wound cleaned and bandaged in the ED. Return precautions given to nursing facility.  Final Clinical Impressions(s) / ED Diagnoses   Final diagnoses:  None    ED Discharge Orders    None       Kali Deadwyler, Ambrose Finland, MD 04/23/18 2349

## 2018-04-23 NOTE — ED Notes (Signed)
Bed: VZ48 Expected date:  Expected time:  Means of arrival:  Comments: EMS combative dementia from SNF/fall

## 2018-04-23 NOTE — ED Triage Notes (Signed)
Pt has a DNR. Staff heard pt fall, found on floor. Combative at baseline. Hospice pt. No injuries noted per ems. No blood thinners

## 2018-04-24 ENCOUNTER — Other Ambulatory Visit: Payer: Self-pay

## 2018-11-08 ENCOUNTER — Emergency Department (HOSPITAL_COMMUNITY)

## 2018-11-08 ENCOUNTER — Encounter (HOSPITAL_COMMUNITY): Payer: Self-pay | Admitting: Emergency Medicine

## 2018-11-08 ENCOUNTER — Emergency Department (HOSPITAL_COMMUNITY)
Admission: EM | Admit: 2018-11-08 | Discharge: 2018-11-08 | Disposition: A | Discharge status: Skilled Nursing Facility | Attending: Emergency Medicine | Admitting: Emergency Medicine

## 2018-11-08 DIAGNOSIS — S0990XA Unspecified injury of head, initial encounter: Secondary | ICD-10-CM | POA: Diagnosis present

## 2018-11-08 DIAGNOSIS — Z23 Encounter for immunization: Secondary | ICD-10-CM | POA: Insufficient documentation

## 2018-11-08 DIAGNOSIS — Z79899 Other long term (current) drug therapy: Secondary | ICD-10-CM | POA: Diagnosis not present

## 2018-11-08 DIAGNOSIS — S0121XA Laceration without foreign body of nose, initial encounter: Secondary | ICD-10-CM | POA: Insufficient documentation

## 2018-11-08 DIAGNOSIS — Y999 Unspecified external cause status: Secondary | ICD-10-CM | POA: Diagnosis not present

## 2018-11-08 DIAGNOSIS — Z91010 Allergy to peanuts: Secondary | ICD-10-CM | POA: Diagnosis not present

## 2018-11-08 DIAGNOSIS — W19XXXA Unspecified fall, initial encounter: Secondary | ICD-10-CM | POA: Insufficient documentation

## 2018-11-08 DIAGNOSIS — Z96641 Presence of right artificial hip joint: Secondary | ICD-10-CM | POA: Diagnosis not present

## 2018-11-08 DIAGNOSIS — Y92129 Unspecified place in nursing home as the place of occurrence of the external cause: Secondary | ICD-10-CM | POA: Diagnosis not present

## 2018-11-08 DIAGNOSIS — N189 Chronic kidney disease, unspecified: Secondary | ICD-10-CM | POA: Diagnosis not present

## 2018-11-08 DIAGNOSIS — Y939 Activity, unspecified: Secondary | ICD-10-CM | POA: Insufficient documentation

## 2018-11-08 DIAGNOSIS — F0390 Unspecified dementia without behavioral disturbance: Secondary | ICD-10-CM | POA: Diagnosis not present

## 2018-11-08 LAB — CBC
HCT: 33 % — ABNORMAL LOW (ref 36.0–46.0)
HEMOGLOBIN: 10.2 g/dL — AB (ref 12.0–15.0)
MCH: 28.7 pg (ref 26.0–34.0)
MCHC: 30.9 g/dL (ref 30.0–36.0)
MCV: 92.7 fL (ref 80.0–100.0)
PLATELETS: 224 10*3/uL (ref 150–400)
RBC: 3.56 MIL/uL — AB (ref 3.87–5.11)
RDW: 14.2 % (ref 11.5–15.5)
WBC: 7.4 10*3/uL (ref 4.0–10.5)
nRBC: 0 % (ref 0.0–0.2)

## 2018-11-08 LAB — BASIC METABOLIC PANEL
ANION GAP: 8 (ref 5–15)
BUN: 20 mg/dL (ref 8–23)
CO2: 27 mmol/L (ref 22–32)
Calcium: 8.7 mg/dL — ABNORMAL LOW (ref 8.9–10.3)
Chloride: 104 mmol/L (ref 98–111)
Creatinine, Ser: 0.89 mg/dL (ref 0.44–1.00)
GFR calc Af Amer: >60 mL/min (ref >60)
GFR, EST NON AFRICAN AMERICAN: 59 mL/min — AB (ref >60)
GLUCOSE: 100 mg/dL — AB (ref 70–99)
POTASSIUM: 4 mmol/L (ref 3.5–5.1)
SODIUM: 139 mmol/L (ref 135–145)

## 2018-11-08 MED ORDER — TETANUS-DIPHTH-ACELL PERTUSSIS 5-2.5-18.5 LF-MCG/0.5 IM SUSP
0.5000 mL | Freq: Once | INTRAMUSCULAR | Status: AC
  Administered 2018-11-08: 0.5 mL via INTRAMUSCULAR
  Filled 2018-11-08: qty 0.5

## 2018-11-08 NOTE — ED Triage Notes (Signed)
Per EMS-mechanical fall, unwitnessed-abrasion to bridge of nose, placed in [facility]-easily agitated due to her dementia-at baseline mentally-nose appears to be displaced-not on blood thinners

## 2018-11-08 NOTE — ED Notes (Signed)
Pt very aggressive while trying to complete EKG.

## 2018-11-08 NOTE — ED Notes (Addendum)
Skin Assessment at 1823, not 1623

## 2018-11-08 NOTE — ED Notes (Signed)
Laceration on forehead cleaned and bandaged with non stick guaze. Bleeding controlled. Attempted blood draw without success. Will reattempt when pt returns from CT scan.

## 2018-11-08 NOTE — ED Notes (Signed)
Pt resting. VS rechecked. Tetanus shot given. Pt in no acute distress

## 2018-11-08 NOTE — ED Provider Notes (Signed)
[facility]-EMERGENCY DEPT Provider Note   CSN: [id number] Arrival date & time: 11/08/18  1745    History   Chief Complaint Chief Complaint  Patient presents with   Fall    HPI Connie Holder is a 85 y.o. female.   HPI Patient presents to the emergency room for evaluation of an unwitnessed fall.  Patient has a history of dementia.  She is a resident of a nursing facility.  Patient was found with injury to her nose and face.  Patient is unable to provide any history.  No history of fever.  No known vomiting or diarrhea. Past Medical History:  Diagnosis Date   Cachexia (HCC)    Chronic kidney disease    Dementia (HCC)    RA (rheumatoid arthritis) (HCC)     Patient Active Problem List   Diagnosis Date Noted   Normocytic anemia 08/12/2015   Closed right hip fracture (HCC) 08/09/2015   Fall at home 08/09/2015   RA (rheumatoid arthritis) (HCC) 08/09/2015   Dementia (HCC) 08/09/2015    Past Surgical History:  Procedure Laterality Date   ABDOMINAL HYSTERECTOMY     TOTAL HIP ARTHROPLASTY Right 08/10/2015   Procedure: RIGHT HEMI-HIP ARTHROPLASTY ANTERIOR APPROACH;  Surgeon: Tarry Kos, MD;  Location: [facility] OR;  Service: Orthopedics;  Laterality: Right;     OB History   No obstetric history on file.      Home Medications    Prior to Admission medications   Medication Sig Start Date End Date Taking? Authorizing Provider  acetaminophen (TYLENOL) 325 MG tablet Take 2 tablets (650 mg total) by mouth every 6 (six) hours as needed for mild pain (or Fever >/= 101). Patient not taking: Reported on 04/23/2018 08/12/15   Osvaldo Shipper, MD  bacitracin ointment Apply 1 application topically 2 (two) times daily. 04/23/18   Little, Ambrose Finland, MD  benzonatate (TESSALON) 100 MG capsule Take 1 capsule (100 mg total) by mouth every 8 (eight) hours. Patient not taking: Reported on 04/23/2018 02/03/17   Charlestine Night, PA-C  bisacodyl (DULCOLAX) 10  MG suppository Place 10 mg rectally daily as needed for moderate constipation.    [provider]  busPIRone (BUSPAR) 5 MG tablet Take 5 mg by mouth 3 (three) times daily.    [provider]  citalopram (CELEXA) 20 MG tablet Take 20 mg by mouth daily.    [provider]  donepezil (ARICEPT) 10 MG tablet Take 10 mg by mouth at bedtime.    [provider]  Guaifenesin 1200 MG TB12 Take 1 tablet (1,200 mg total) by mouth 2 (two) times daily. Patient not taking: Reported on 04/23/2018 02/03/17   Charlestine Night, PA-C  haloperidol (HALDOL) 0.5 MG tablet Take 1 tablet (0.5 mg total) by mouth every 8 (eight) hours as needed for agitation. Patient not taking: Reported on 04/23/2018 08/13/15   Osvaldo Shipper, MD  levothyroxine (SYNTHROID, LEVOTHROID) 100 MCG tablet Take 100 mcg by mouth daily before breakfast.    [provider]  LORazepam (ATIVAN) 0.5 MG tablet Take 0.5 mg by mouth every 4 (four) hours as needed for anxiety.    [provider]  Melatonin 3 MG TABS Take 3 mg by mouth at bedtime.    [provider]  methocarbamol (ROBAXIN) 500 MG tablet Take 1 tablet (500 mg total) by mouth every 6 (six) hours as needed for muscle spasms. Patient not taking: Reported on 04/23/2018 08/12/15   Osvaldo Shipper, MD  morphine (MSIR) 15 MG tablet  Take 15 mg by mouth every 12 (twelve) hours.    [provider]  oxyCODONE (OXY IR/ROXICODONE) 5 MG immediate release tablet Take 1-3 tablets (5-15 mg total) by mouth every 4 (four) hours as needed. Patient not taking: Reported on 04/23/2018 08/10/15   Tarry KosXu, Naiping M, MD  pantoprazole (PROTONIX) 40 MG tablet Take 40 mg by mouth daily.    [provider]  QUEtiapine (SEROQUEL) 100 MG tablet Take 100 mg by mouth 2 (two) times daily.    [provider]  QUEtiapine (SEROQUEL) 25 MG tablet Take 0.5 tablets (12.5 mg total) by mouth at bedtime. Patient not taking: Reported on 04/23/2018  08/12/15   Osvaldo ShipperKrishnan, Gokul, MD  sennosides-docusate sodium (SENOKOT-S) 8.6-50 MG tablet Take 2 tablets by mouth daily.    [provider]    Family History No family history on file.  Social History Social History   Tobacco Use   Smoking status: Never Smoker  Substance Use Topics   Alcohol use: No   Drug use: Never     Allergies   Aspirin; Other; Peanut oil; Zolpidem; Donepezil; Lorazepam; and Quetiapine   Review of Systems Review of Systems  All other systems reviewed and are negative.    Physical Exam Updated Vital Signs BP 131/89 (BP Location: Left Arm)    Pulse 98    Temp 98.5 F (36.9 C) (Oral)    Resp 20    SpO2 98%   Physical Exam Vitals signs and nursing note reviewed.  Constitutional:      General: She is not in acute distress.    Appearance: She is well-developed.     Comments: Elderly, frail  HENT:     Head: Normocephalic and atraumatic.     Right Ear: External ear normal.     Left Ear: External ear normal.     Nose:     Comments: Stellate laceration to the bridge of the nose, no gross deformity of the nose, and the Thoma to the forehead Eyes:     General: No scleral icterus.       Right eye: No discharge.        Left eye: No discharge.     Conjunctiva/sclera: Conjunctivae normal.  Neck:     Musculoskeletal: Neck supple.     Trachea: No tracheal deviation.  Cardiovascular:     Rate and Rhythm: Normal rate and regular rhythm.  Pulmonary:     Effort: Pulmonary effort is normal. No respiratory distress.     Breath sounds: Normal breath sounds. No stridor. No wheezing or rales.  Abdominal:     General: Bowel sounds are normal. There is no distension.     Palpations: Abdomen is soft.     Tenderness: There is no abdominal tenderness. There is no guarding or rebound.  Musculoskeletal:        General: No tenderness.     Comments: Difficult to assess if the patient has pain elsewhere but she does not seem to grimace or have discomfort with  moving any of her extremities, no spinal tenderness  Skin:    General: Skin is warm and dry.     Findings: No rash.  Neurological:     Mental Status: She is alert.     Cranial Nerves: No cranial nerve deficit (no facial droop, extraocular movements intact,  ).     Sensory: No sensory deficit.     Motor: No abnormal muscle tone or seizure activity.     Coordination: Coordination normal.  Comments: Alert but unable to answer any questions, patient mumbles incoherently      ED Treatments / Results  Labs (all labs ordered are listed, but only abnormal results are displayed) Labs Reviewed  CBC - Abnormal; Notable for the following components:      Result Value   RBC 3.56 (*)    Hemoglobin 10.2 (*)    HCT 33.0 (*)    All other components within normal limits  BASIC METABOLIC PANEL - Abnormal; Notable for the following components:   Glucose, Bld 100 (*)    Calcium 8.7 (*)    GFR calc non Af Amer 59 (*)    All other components within normal limits    EKG EKG Interpretation  Date/Time:  Tuesday November 08 2018 19:49:56 EDT Ventricular Rate:  91 PR Interval:    QRS Duration: 93 QT Interval:  535 QTC Calculation: 594 R Axis:   66 Text Interpretation:  Sinus rhythm Paired ventricular premature complexes Anteroseptal infarct, age indeterminate Lateral leads are also involved Prolonged QT interval Baseline wander in lead(s) II aVR Artifact Confirmed by Linwood Dibbles [phone]) on 11/08/2018 8:07:50 PM   Radiology Ct Head Wo Contrast  Result Date: 11/08/2018 CLINICAL DATA:  Status post fall. Abrasion on the nose. Initial encounter. EXAM: CT HEAD WITHOUT CONTRAST CT MAXILLOFACIAL WITHOUT CONTRAST CT CERVICAL SPINE WITHOUT CONTRAST TECHNIQUE: Multidetector CT imaging of the head, cervical spine, and maxillofacial structures were performed using the standard protocol without intravenous contrast. Multiplanar CT image reconstructions of the cervical spine and maxillofacial structures were also  generated. COMPARISON:  Head and cervical spine CT scan 03/18/2018. FINDINGS: CT HEAD FINDINGS Brain: No evidence of acute infarction, hemorrhage, hydrocephalus, extra-axial collection or mass lesion/mass effect. Atrophy and chronic microvascular ischemic change noted. Vascular: No hyperdense vessel or unexpected calcification. Skull: Intact.  No focal lesion. Other: Left mastoid effusion is unchanged. CT MAXILLOFACIAL FINDINGS Osseous: No fracture or mandibular dislocation. No destructive process. Orbits: The globes are intact. The patient is status post cataract surgery. Orbital fat is clear. No fracture. Sinuses: Clear. Soft tissues: Soft tissue contusion left side of the forehead noted. CT CERVICAL SPINE FINDINGS Alignment: Maintained. Skull base and vertebrae: Mild superior endplate compression fractures of T1 and T2 are new since the prior CT. Vertebral body height loss is up to 40% at T1 and 20% at T2. No bony retropulsion or involvement of the posterior elements. No other fracture is identified. Soft tissues and spinal canal: No prevertebral fluid or swelling. No visible canal hematoma. Disc levels: Marked loss of disc space height at C3-4, C4-5 and C5-6 is unchanged. Upper chest: Lung apices are clear. Other: None. IMPRESSION: Mild T1 and T2 superior endplate compression fractures without bony retropulsion or involvement of the posterior elements are new since the prior CT. Soft tissue contusion left side of the forehead without underlying facial bone fracture or acute intracranial abnormality. Atrophy and chronic microvascular ischemic change. Chronic left mastoid effusion. Cervical spondylosis. Electronically Signed   By: Drusilla Kanner M.D.   On: 11/08/2018 19:16   Ct Cervical Spine Wo Contrast  Result Date: 11/08/2018 CLINICAL DATA:  Status post fall. Abrasion on the nose. Initial encounter. EXAM: CT HEAD WITHOUT CONTRAST CT MAXILLOFACIAL WITHOUT CONTRAST CT CERVICAL SPINE WITHOUT CONTRAST  TECHNIQUE: Multidetector CT imaging of the head, cervical spine, and maxillofacial structures were performed using the standard protocol without intravenous contrast. Multiplanar CT image reconstructions of the cervical spine and maxillofacial structures were also generated. COMPARISON:  Head and cervical spine  CT scan 03/18/2018. FINDINGS: CT HEAD FINDINGS Brain: No evidence of acute infarction, hemorrhage, hydrocephalus, extra-axial collection or mass lesion/mass effect. Atrophy and chronic microvascular ischemic change noted. Vascular: No hyperdense vessel or unexpected calcification. Skull: Intact.  No focal lesion. Other: Left mastoid effusion is unchanged. CT MAXILLOFACIAL FINDINGS Osseous: No fracture or mandibular dislocation. No destructive process. Orbits: The globes are intact. The patient is status post cataract surgery. Orbital fat is clear. No fracture. Sinuses: Clear. Soft tissues: Soft tissue contusion left side of the forehead noted. CT CERVICAL SPINE FINDINGS Alignment: Maintained. Skull base and vertebrae: Mild superior endplate compression fractures of T1 and T2 are new since the prior CT. Vertebral body height loss is up to 40% at T1 and 20% at T2. No bony retropulsion or involvement of the posterior elements. No other fracture is identified. Soft tissues and spinal canal: No prevertebral fluid or swelling. No visible canal hematoma. Disc levels: Marked loss of disc space height at C3-4, C4-5 and C5-6 is unchanged. Upper chest: Lung apices are clear. Other: None. IMPRESSION: Mild T1 and T2 superior endplate compression fractures without bony retropulsion or involvement of the posterior elements are new since the prior CT. Soft tissue contusion left side of the forehead without underlying facial bone fracture or acute intracranial abnormality. Atrophy and chronic microvascular ischemic change. Chronic left mastoid effusion. Cervical spondylosis. Electronically Signed   By: Drusilla Kanner M.D.    On: 11/08/2018 19:16   Ct Maxillofacial Wo Contrast  Result Date: 11/08/2018 CLINICAL DATA:  Status post fall. Abrasion on the nose. Initial encounter. EXAM: CT HEAD WITHOUT CONTRAST CT MAXILLOFACIAL WITHOUT CONTRAST CT CERVICAL SPINE WITHOUT CONTRAST TECHNIQUE: Multidetector CT imaging of the head, cervical spine, and maxillofacial structures were performed using the standard protocol without intravenous contrast. Multiplanar CT image reconstructions of the cervical spine and maxillofacial structures were also generated. COMPARISON:  Head and cervical spine CT scan 03/18/2018. FINDINGS: CT HEAD FINDINGS Brain: No evidence of acute infarction, hemorrhage, hydrocephalus, extra-axial collection or mass lesion/mass effect. Atrophy and chronic microvascular ischemic change noted. Vascular: No hyperdense vessel or unexpected calcification. Skull: Intact.  No focal lesion. Other: Left mastoid effusion is unchanged. CT MAXILLOFACIAL FINDINGS Osseous: No fracture or mandibular dislocation. No destructive process. Orbits: The globes are intact. The patient is status post cataract surgery. Orbital fat is clear. No fracture. Sinuses: Clear. Soft tissues: Soft tissue contusion left side of the forehead noted. CT CERVICAL SPINE FINDINGS Alignment: Maintained. Skull base and vertebrae: Mild superior endplate compression fractures of T1 and T2 are new since the prior CT. Vertebral body height loss is up to 40% at T1 and 20% at T2. No bony retropulsion or involvement of the posterior elements. No other fracture is identified. Soft tissues and spinal canal: No prevertebral fluid or swelling. No visible canal hematoma. Disc levels: Marked loss of disc space height at C3-4, C4-5 and C5-6 is unchanged. Upper chest: Lung apices are clear. Other: None. IMPRESSION: Mild T1 and T2 superior endplate compression fractures without bony retropulsion or involvement of the posterior elements are new since the prior CT. Soft tissue contusion  left side of the forehead without underlying facial bone fracture or acute intracranial abnormality. Atrophy and chronic microvascular ischemic change. Chronic left mastoid effusion. Cervical spondylosis. Electronically Signed   By: Drusilla Kanner M.D.   On: 11/08/2018 19:16    Procedures Procedures (including critical care time)  Medications Ordered in ED Medications  Tdap (BOOSTRIX) injection 0.5 mL (0.5 mLs Intramuscular Given 11/08/18 2034)  Initial Impression / Assessment and Plan / ED Course  I have reviewed the triage vital signs and the nursing notes.  Pertinent labs & imaging results that were available during my care of the patient were reviewed by me and considered in my medical decision making (see chart for details).   Pt presented to the ED after a fall. Labs are stable.   CT scans notable for possible thoracic compression fx.  Unclear if acute but no need for intervention, treatment.  LAceration repaired by PA Luevenia MaxinFawze.  Pt stable for dc back to nursing facility. Final Clinical Impressions(s) / ED Diagnoses   Final diagnoses:  Laceration of nose, initial encounter  Fall, initial encounter    ED Discharge Orders    None       Linwood DibblesKnapp, Emeli Goguen, MD 11/08/18 2053

## 2018-11-08 NOTE — ED Provider Notes (Signed)
..  Laceration Repair Date/Time: 11/08/2018 8:24 PM Performed by: Jeanie Sewer, PA-C Authorized by: Jeanie Sewer, PA-C   Consent:    Consent obtained:  Verbal   Consent given by:  Patient   Risks discussed:  Infection, pain, need for additional repair, poor cosmetic result and poor wound healing   Alternatives discussed:  No treatment Universal protocol:    Procedure explained and questions answered to patient or proxy's satisfaction: yes     Relevant documents present and verified: yes     Test results available and properly labeled: yes     Imaging studies available: yes     Required blood products, implants, devices, and special equipment available: yes     Site/side marked: yes     Immediately prior to procedure, a time out was called: yes     Patient identity confirmed:  Arm band and provided demographic data Anesthesia (see MAR for exact dosages):    Anesthesia method:  None Laceration details:    Location:  Face   Face location:  Nose   Length (cm):  2.5   Depth (mm):  2 Repair type:    Repair type:  Simple Pre-procedure details:    Preparation:  Imaging obtained to evaluate for foreign bodies and patient was prepped and draped in usual sterile fashion Exploration:    Hemostasis achieved with:  Direct pressure   Wound exploration: wound explored through full range of motion     Wound extent: areolar tissue violated   Treatment:    Area cleansed with:  Saline and Shur-Clens   Amount of cleaning:  Extensive   Irrigation solution:  Sterile saline   Irrigation method:  Syringe   Visualized foreign bodies/material removed: no   Skin repair:    Repair method:  Steri-Strips and tissue adhesive   Number of Steri-Strips:  6 Approximation:    Approximation:  Close Post-procedure details:    Dressing:  Open (no dressing)   Patient tolerance of procedure:  Tolerated well, no immediate complications      Connie Holder 11/08/18 2025    Connie Dibbles, MD 11/09/18  [phone]

## 2018-11-08 NOTE — ED Notes (Signed)
[facility] called for transport home. 

## 2018-11-08 NOTE — ED Notes (Signed)
Pt moved to room 11 to obtain an EKG and allow provider to suture nose. Pt consolable

## 2018-11-08 NOTE — ED Notes (Signed)
Bed: [facility] Expected date:  Expected time:  Means of arrival:  Comments: EMS-fall/dementia 

## 2018-11-08 NOTE — Discharge Instructions (Signed)
The xray showed mild compression fx in the upper spine.  It is unclear if this is related to the fall.  No acute treatment is needed.   The  nasal laceration was treated with dermabond

## 2019-12-23 DEATH — deceased

## 2020-12-14 IMAGING — CT CT CERVICAL SPINE WITHOUT CONTRAST
3 of 14 series · 5 of 33 positions shown, 6 images · non-contrast
Comparison: Head and cervical spine CT scan 03/18/2018.

CLINICAL DATA: Status post fall. Abrasion on the nose. Initial
encounter.

EXAM:
CT HEAD WITHOUT CONTRAST
CT MAXILLOFACIAL WITHOUT CONTRAST
CT CERVICAL SPINE WITHOUT CONTRAST
TECHNIQUE: Multidetector CT imaging of the head, cervical spine, and
maxillofacial structures were performed using the standard protocol
without intravenous contrast. Multiplanar CT image reconstructions
of the cervical spine and maxillofacial structures were also
generated.

[Series 13: orthogonal axials · axial · 0.23mm/px · z∈[+1019,+1176]mm · 3 of 82 slices shown, 4 images]
[im 1/82  soft-tissue]
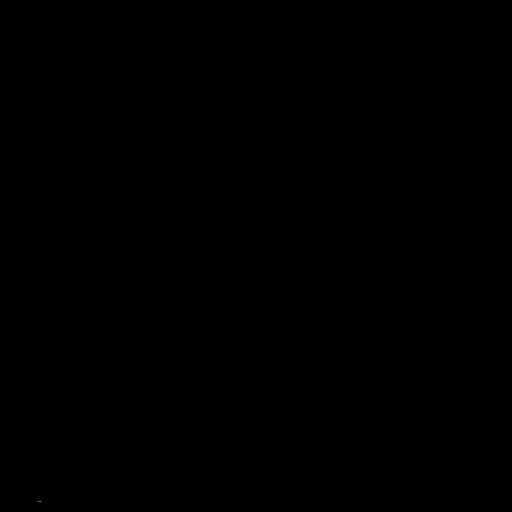
[im 1/82  bone]
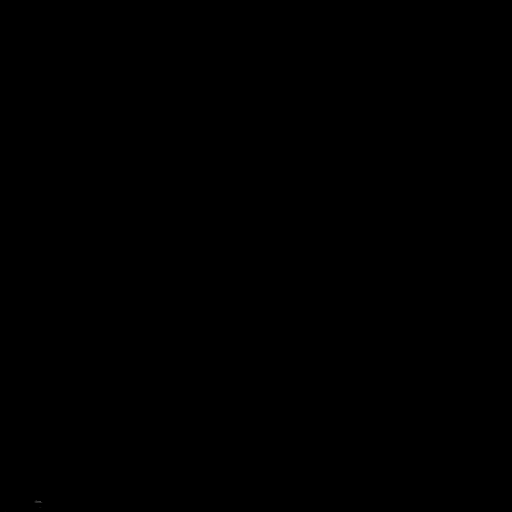
[im 41/82  bone]
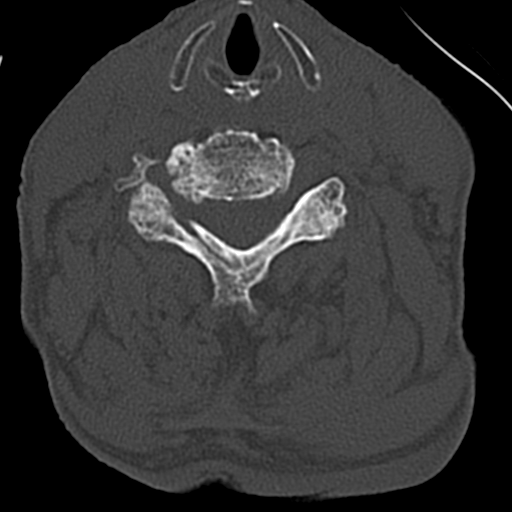
[im 82/82  bone]
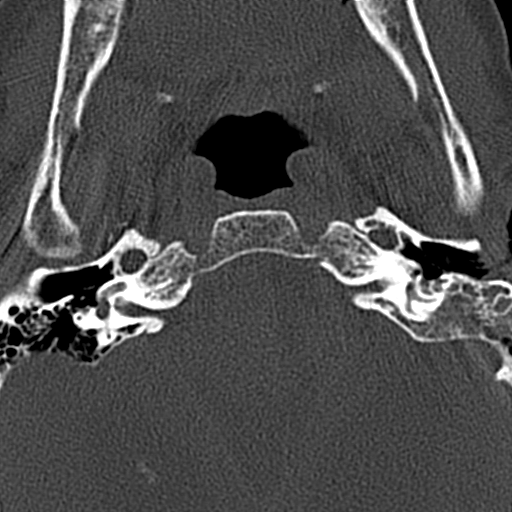

[Series 14: coronal bone 1 · coronal · 0.24mm/px · 1 of 61 slices shown]
[im 31/61  bone]
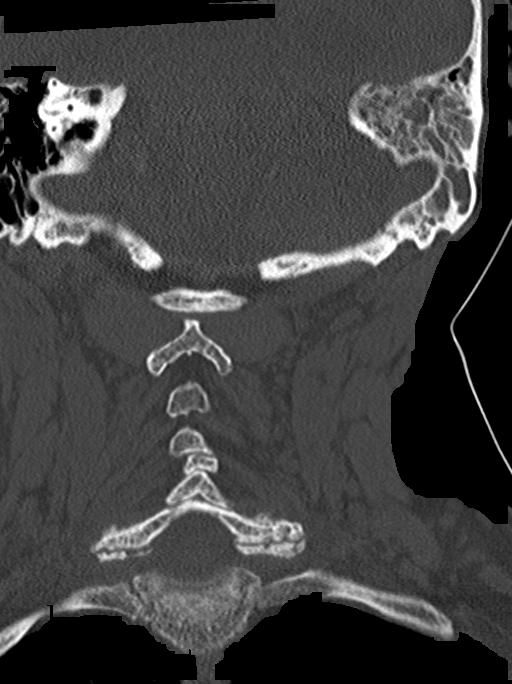

[Series 16: sagittal bone · sagittal · 0.24mm/px · 1 of 47 slices shown]
[im 24/47  bone]
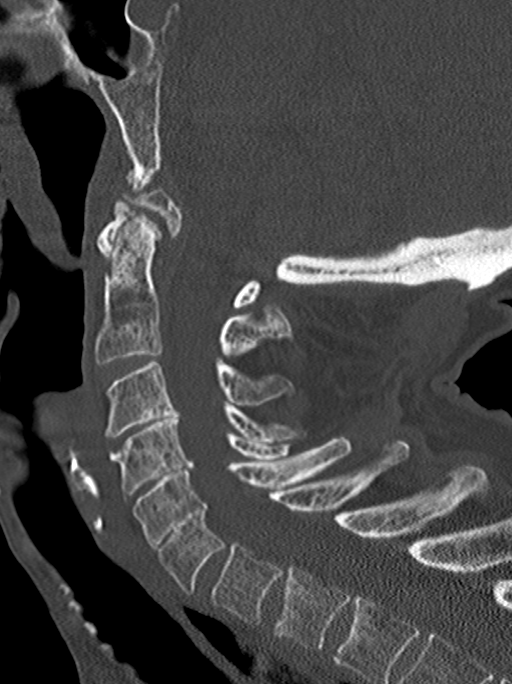

[5 of 33 positions shown; findings below may reference images not displayed]

FINDINGS: CT HEAD FINDINGS

Brain: No evidence of acute infarction, hemorrhage, hydrocephalus,
extra-axial collection or mass lesion/mass effect. Atrophy and
chronic microvascular ischemic change noted.

Vascular: No hyperdense vessel or unexpected calcification.

Skull: Intact.  No focal lesion.

Other: Left mastoid effusion is unchanged.

CT MAXILLOFACIAL FINDINGS

Osseous: No fracture or mandibular dislocation. No destructive
process.

Orbits: The globes are intact. The patient is status post cataract
surgery. Orbital fat is clear. No fracture.

Sinuses: Clear.

Soft tissues: Soft tissue contusion left side of the forehead noted.

CT CERVICAL SPINE FINDINGS

Alignment: Maintained.

Skull base and vertebrae: Mild superior endplate compression
fractures of T1 and T2 are new since the prior CT. Vertebral body
height loss is up to 40% at T1 and 20% at T2. No bony retropulsion
or involvement of the posterior elements. No other fracture is
identified.

Soft tissues and spinal canal: No prevertebral fluid or swelling. No
visible canal hematoma.

Disc levels: Marked loss of disc space height at C3-4, C4-5 and C5-6
is unchanged.

Upper chest: Lung apices are clear.

Other: None.
IMPRESSION: Mild T1 and T2 superior endplate compression fractures without bony
retropulsion or involvement of the posterior elements are new since
the prior CT.

Soft tissue contusion left side of the forehead without underlying
facial bone fracture or acute intracranial abnormality.

Atrophy and chronic microvascular ischemic change.

Chronic left mastoid effusion.

Cervical spondylosis.
